# Patient Record
Sex: Female | Born: 1935 | Race: Black or African American | Hispanic: No | Marital: Married | Smoking: Never smoker
Health system: Southern US, Community
[De-identification: ages and names within clinical notes are randomized; demographics above are authoritative.]

## PROBLEM LIST (undated history)

## (undated) DIAGNOSIS — M8580 Other specified disorders of bone density and structure, unspecified site: Secondary | ICD-10-CM

## (undated) DIAGNOSIS — Z860101 Personal history of adenomatous and serrated colon polyps: Secondary | ICD-10-CM

## (undated) DIAGNOSIS — I493 Ventricular premature depolarization: Secondary | ICD-10-CM

## (undated) DIAGNOSIS — K08109 Complete loss of teeth, unspecified cause, unspecified class: Secondary | ICD-10-CM

## (undated) DIAGNOSIS — R351 Nocturia: Secondary | ICD-10-CM

## (undated) DIAGNOSIS — I319 Disease of pericardium, unspecified: Secondary | ICD-10-CM

## (undated) DIAGNOSIS — N1330 Unspecified hydronephrosis: Secondary | ICD-10-CM

## (undated) DIAGNOSIS — Z972 Presence of dental prosthetic device (complete) (partial): Secondary | ICD-10-CM

## (undated) DIAGNOSIS — Z87898 Personal history of other specified conditions: Secondary | ICD-10-CM

## (undated) DIAGNOSIS — I10 Essential (primary) hypertension: Secondary | ICD-10-CM

## (undated) DIAGNOSIS — M1990 Unspecified osteoarthritis, unspecified site: Secondary | ICD-10-CM

## (undated) DIAGNOSIS — H8110 Benign paroxysmal vertigo, unspecified ear: Secondary | ICD-10-CM

## (undated) DIAGNOSIS — K219 Gastro-esophageal reflux disease without esophagitis: Secondary | ICD-10-CM

## (undated) DIAGNOSIS — Z86010 Personal history of colonic polyps: Secondary | ICD-10-CM

## (undated) DIAGNOSIS — C801 Malignant (primary) neoplasm, unspecified: Secondary | ICD-10-CM

## (undated) DIAGNOSIS — E890 Postprocedural hypothyroidism: Secondary | ICD-10-CM

## (undated) DIAGNOSIS — E119 Type 2 diabetes mellitus without complications: Secondary | ICD-10-CM

## (undated) HISTORY — DX: Personal history of other specified conditions: Z87.898

## (undated) HISTORY — DX: Essential (primary) hypertension: I10

## (undated) HISTORY — DX: Other specified disorders of bone density and structure, unspecified site: M85.80

## (undated) HISTORY — PX: COLONOSCOPY W/ POLYPECTOMY: SHX1380

## (undated) HISTORY — PX: TRANSTHORACIC ECHOCARDIOGRAM: SHX275

## (undated) HISTORY — DX: Malignant (primary) neoplasm, unspecified: C80.1

---

## 1982-10-09 HISTORY — PX: TOTAL ABDOMINAL HYSTERECTOMY W/ BILATERAL SALPINGOOPHORECTOMY: SHX83

## 2000-01-23 ENCOUNTER — Encounter: Admission: RE | Admit: 2000-01-23 | Discharge: 2000-01-23 | Payer: Self-pay | Admitting: *Deleted

## 2000-01-23 ENCOUNTER — Encounter: Payer: Self-pay | Admitting: *Deleted

## 2001-02-11 ENCOUNTER — Ambulatory Visit (HOSPITAL_COMMUNITY): Admission: RE | Admit: 2001-02-11 | Discharge: 2001-02-11 | Payer: Self-pay | Admitting: Gastroenterology

## 2001-02-11 ENCOUNTER — Encounter (INDEPENDENT_AMBULATORY_CARE_PROVIDER_SITE_OTHER): Payer: Self-pay

## 2001-02-26 ENCOUNTER — Other Ambulatory Visit: Admission: RE | Admit: 2001-02-26 | Discharge: 2001-02-26 | Payer: Self-pay | Admitting: *Deleted

## 2001-03-05 ENCOUNTER — Encounter: Payer: Self-pay | Admitting: General Surgery

## 2001-03-12 ENCOUNTER — Encounter (INDEPENDENT_AMBULATORY_CARE_PROVIDER_SITE_OTHER): Payer: Self-pay | Admitting: Specialist

## 2001-03-12 ENCOUNTER — Inpatient Hospital Stay (HOSPITAL_COMMUNITY): Admission: RE | Admit: 2001-03-12 | Discharge: 2001-03-14 | Payer: Self-pay | Admitting: General Surgery

## 2001-03-12 HISTORY — PX: OTHER SURGICAL HISTORY: SHX169

## 2003-06-24 ENCOUNTER — Encounter (INDEPENDENT_AMBULATORY_CARE_PROVIDER_SITE_OTHER): Payer: Self-pay | Admitting: *Deleted

## 2003-06-24 ENCOUNTER — Ambulatory Visit (HOSPITAL_COMMUNITY): Admission: RE | Admit: 2003-06-24 | Discharge: 2003-06-24 | Payer: Self-pay | Admitting: Gastroenterology

## 2004-04-04 ENCOUNTER — Other Ambulatory Visit: Admission: RE | Admit: 2004-04-04 | Discharge: 2004-04-04 | Payer: Self-pay | Admitting: Family Medicine

## 2007-01-15 ENCOUNTER — Ambulatory Visit: Payer: Self-pay | Admitting: Internal Medicine

## 2007-01-15 LAB — CONVERTED CEMR LAB
ALT: 18 units/L (ref 0–40)
AST: 21 units/L (ref 0–37)
Albumin: 3.4 g/dL — ABNORMAL LOW (ref 3.5–5.2)
Alkaline Phosphatase: 79 units/L (ref 39–117)
BUN: 10 mg/dL (ref 6–23)
Basophils Absolute: 0 10*3/uL (ref 0.0–0.1)
Basophils Relative: 0.9 % (ref 0.0–1.0)
Bilirubin, Direct: 0.1 mg/dL (ref 0.0–0.3)
CO2: 32 meq/L (ref 19–32)
Calcium: 9.4 mg/dL (ref 8.4–10.5)
Chloride: 109 meq/L (ref 96–112)
Cholesterol: 192 mg/dL (ref 0–200)
Creatinine, Ser: 0.8 mg/dL (ref 0.4–1.2)
Eosinophils Absolute: 0.1 10*3/uL (ref 0.0–0.6)
Eosinophils Relative: 1.4 % (ref 0.0–5.0)
GFR calc Af Amer: 91 mL/min
GFR calc non Af Amer: 75 mL/min
Glucose, Bld: 112 mg/dL — ABNORMAL HIGH (ref 70–99)
HCT: 42.3 % (ref 36.0–46.0)
HDL: 53.6 mg/dL (ref >39.0)
Hemoglobin: 14.9 g/dL (ref 12.0–15.0)
LDL Cholesterol: 114 mg/dL — ABNORMAL HIGH (ref 0–99)
Lymphocytes Relative: 47.6 % — ABNORMAL HIGH (ref 12.0–46.0)
MCHC: 35.2 g/dL (ref 30.0–36.0)
MCV: 88.4 fL (ref 78.0–100.0)
Monocytes Absolute: 0.2 10*3/uL (ref 0.2–0.7)
Monocytes Relative: 5.4 % (ref 3.0–11.0)
Neutro Abs: 1.9 10*3/uL (ref 1.4–7.7)
Neutrophils Relative %: 44.7 % (ref 43.0–77.0)
Platelets: 186 10*3/uL (ref 150–400)
Potassium: 4.4 meq/L (ref 3.5–5.1)
RBC: 4.79 M/uL (ref 3.87–5.11)
RDW: 12.6 % (ref 11.5–14.6)
Sodium: 144 meq/L (ref 135–145)
TSH: 1.13 microintl units/mL (ref 0.35–5.50)
Total Bilirubin: 0.6 mg/dL (ref 0.3–1.2)
Total CHOL/HDL Ratio: 3.6
Total Protein: 6.6 g/dL (ref 6.0–8.3)
Triglycerides: 122 mg/dL (ref 0–149)
VLDL: 24 mg/dL (ref 0–40)
WBC: 4.2 10*3/uL — ABNORMAL LOW (ref 4.5–10.5)

## 2007-03-14 ENCOUNTER — Ambulatory Visit: Payer: Self-pay | Admitting: Internal Medicine

## 2007-07-11 ENCOUNTER — Ambulatory Visit: Payer: Self-pay | Admitting: Internal Medicine

## 2007-07-24 ENCOUNTER — Ambulatory Visit: Payer: Self-pay | Admitting: Internal Medicine

## 2007-07-24 LAB — CONVERTED CEMR LAB
ALT: 16 units/L (ref 0–35)
AST: 21 units/L (ref 0–37)
Basophils Relative: 0.9 % (ref 0.0–1.0)
Bilirubin, Direct: 0.1 mg/dL (ref 0.0–0.3)
CO2: 31 meq/L (ref 19–32)
Calcium: 9.3 mg/dL (ref 8.4–10.5)
Chloride: 110 meq/L (ref 96–112)
Creatinine, Ser: 0.8 mg/dL (ref 0.4–1.2)
Eosinophils Relative: 1.2 % (ref 0.0–5.0)
Glucose, Bld: 110 mg/dL — ABNORMAL HIGH (ref 70–99)
Ketones, ur: NEGATIVE mg/dL
LDL Cholesterol: 112 mg/dL — ABNORMAL HIGH (ref 0–99)
Nitrite: NEGATIVE
Platelets: 164 10*3/uL (ref 150–400)
RBC: 4.34 M/uL (ref 3.87–5.11)
Specific Gravity, Urine: 1.02 (ref 1.000–1.03)
Total Bilirubin: 0.8 mg/dL (ref 0.3–1.2)
Total CHOL/HDL Ratio: 3.3
Total Protein, Urine: NEGATIVE mg/dL
Total Protein: 6.3 g/dL (ref 6.0–8.3)
Urine Glucose: NEGATIVE mg/dL
Urobilinogen, UA: 0.2 (ref 0.0–1.0)
WBC: 4.2 10*3/uL — ABNORMAL LOW (ref 4.5–10.5)

## 2007-08-05 ENCOUNTER — Ambulatory Visit: Payer: Self-pay | Admitting: Internal Medicine

## 2007-08-05 DIAGNOSIS — I10 Essential (primary) hypertension: Secondary | ICD-10-CM | POA: Insufficient documentation

## 2007-08-05 DIAGNOSIS — R0789 Other chest pain: Secondary | ICD-10-CM | POA: Insufficient documentation

## 2007-08-08 ENCOUNTER — Ambulatory Visit: Payer: Self-pay

## 2007-08-08 ENCOUNTER — Encounter: Payer: Self-pay | Admitting: Internal Medicine

## 2007-08-14 DIAGNOSIS — M899 Disorder of bone, unspecified: Secondary | ICD-10-CM | POA: Insufficient documentation

## 2007-08-14 DIAGNOSIS — Z87898 Personal history of other specified conditions: Secondary | ICD-10-CM | POA: Insufficient documentation

## 2007-08-14 DIAGNOSIS — M949 Disorder of cartilage, unspecified: Secondary | ICD-10-CM

## 2007-09-02 ENCOUNTER — Ambulatory Visit: Payer: Self-pay | Admitting: Internal Medicine

## 2007-09-02 DIAGNOSIS — I319 Disease of pericardium, unspecified: Secondary | ICD-10-CM | POA: Insufficient documentation

## 2007-09-04 ENCOUNTER — Ambulatory Visit: Payer: Self-pay | Admitting: Cardiology

## 2007-09-11 LAB — CONVERTED CEMR LAB
C3 Complement: 121 mg/dL (ref 88–201)
ds DNA Ab: <1 (ref <5)

## 2007-09-13 ENCOUNTER — Encounter: Admission: RE | Admit: 2007-09-13 | Discharge: 2007-09-13 | Payer: Self-pay | Admitting: Internal Medicine

## 2007-09-19 ENCOUNTER — Encounter (INDEPENDENT_AMBULATORY_CARE_PROVIDER_SITE_OTHER): Payer: Self-pay | Admitting: Interventional Radiology

## 2007-09-19 ENCOUNTER — Encounter: Payer: Self-pay | Admitting: Internal Medicine

## 2007-09-19 ENCOUNTER — Encounter: Admission: RE | Admit: 2007-09-19 | Discharge: 2007-09-19 | Payer: Self-pay | Admitting: Internal Medicine

## 2007-09-19 ENCOUNTER — Other Ambulatory Visit: Admission: RE | Admit: 2007-09-19 | Discharge: 2007-09-19 | Payer: Self-pay | Admitting: Interventional Radiology

## 2007-10-28 ENCOUNTER — Ambulatory Visit: Payer: Self-pay | Admitting: Internal Medicine

## 2007-11-08 ENCOUNTER — Telehealth: Payer: Self-pay | Admitting: Internal Medicine

## 2007-11-25 ENCOUNTER — Ambulatory Visit: Payer: Self-pay | Admitting: Endocrinology

## 2007-11-25 ENCOUNTER — Telehealth: Payer: Self-pay | Admitting: Internal Medicine

## 2008-02-24 ENCOUNTER — Ambulatory Visit: Payer: Self-pay | Admitting: Internal Medicine

## 2008-02-24 DIAGNOSIS — H8110 Benign paroxysmal vertigo, unspecified ear: Secondary | ICD-10-CM

## 2008-02-28 ENCOUNTER — Encounter: Admission: RE | Admit: 2008-02-28 | Discharge: 2008-03-23 | Payer: Self-pay | Admitting: Internal Medicine

## 2008-02-28 ENCOUNTER — Encounter: Payer: Self-pay | Admitting: Internal Medicine

## 2008-03-10 ENCOUNTER — Encounter: Admission: RE | Admit: 2008-03-10 | Discharge: 2008-03-10 | Payer: Self-pay | Admitting: Endocrinology

## 2008-03-17 ENCOUNTER — Telehealth: Payer: Self-pay | Admitting: Endocrinology

## 2008-04-22 ENCOUNTER — Encounter: Payer: Self-pay | Admitting: Internal Medicine

## 2008-06-12 ENCOUNTER — Ambulatory Visit: Payer: Self-pay | Admitting: Internal Medicine

## 2008-06-12 LAB — CONVERTED CEMR LAB
BUN: 11 mg/dL (ref 6–23)
Cholesterol: 163 mg/dL (ref 0–200)
GFR calc Af Amer: 91 mL/min
Glucose, Bld: 103 mg/dL — ABNORMAL HIGH (ref 70–99)
HDL: 50.9 mg/dL (ref >39.0)
Potassium: 3.6 meq/L (ref 3.5–5.1)
VLDL: 22 mg/dL (ref 0–40)

## 2008-06-22 ENCOUNTER — Ambulatory Visit: Payer: Self-pay | Admitting: Internal Medicine

## 2008-08-06 ENCOUNTER — Ambulatory Visit: Payer: Self-pay | Admitting: Endocrinology

## 2008-08-06 DIAGNOSIS — E042 Nontoxic multinodular goiter: Secondary | ICD-10-CM

## 2008-11-20 ENCOUNTER — Encounter: Payer: Self-pay | Admitting: Internal Medicine

## 2008-12-01 ENCOUNTER — Encounter: Payer: Self-pay | Admitting: Internal Medicine

## 2008-12-08 ENCOUNTER — Encounter: Payer: Self-pay | Admitting: Internal Medicine

## 2008-12-08 LAB — HM COLONOSCOPY: HM Colonoscopy: ABNORMAL

## 2008-12-21 ENCOUNTER — Encounter: Payer: Self-pay | Admitting: Internal Medicine

## 2008-12-30 ENCOUNTER — Ambulatory Visit: Payer: Self-pay | Admitting: Internal Medicine

## 2008-12-30 DIAGNOSIS — K644 Residual hemorrhoidal skin tags: Secondary | ICD-10-CM | POA: Insufficient documentation

## 2009-01-12 ENCOUNTER — Ambulatory Visit: Payer: Self-pay | Admitting: Internal Medicine

## 2009-04-14 ENCOUNTER — Ambulatory Visit: Payer: Self-pay | Admitting: Internal Medicine

## 2009-04-14 DIAGNOSIS — N951 Menopausal and female climacteric states: Secondary | ICD-10-CM

## 2009-04-14 LAB — CONVERTED CEMR LAB

## 2009-04-16 ENCOUNTER — Encounter: Payer: Self-pay | Admitting: Internal Medicine

## 2009-04-22 ENCOUNTER — Encounter: Payer: Self-pay | Admitting: Internal Medicine

## 2009-06-01 ENCOUNTER — Telehealth: Payer: Self-pay | Admitting: Internal Medicine

## 2009-07-01 ENCOUNTER — Ambulatory Visit: Payer: Self-pay | Admitting: Internal Medicine

## 2009-12-15 ENCOUNTER — Ambulatory Visit: Payer: Self-pay | Admitting: Internal Medicine

## 2009-12-15 DIAGNOSIS — R002 Palpitations: Secondary | ICD-10-CM

## 2010-05-06 ENCOUNTER — Encounter: Payer: Self-pay | Admitting: Internal Medicine

## 2010-05-11 ENCOUNTER — Encounter: Payer: Self-pay | Admitting: Internal Medicine

## 2010-06-30 ENCOUNTER — Ambulatory Visit: Payer: Self-pay | Admitting: Internal Medicine

## 2010-07-04 ENCOUNTER — Telehealth: Payer: Self-pay | Admitting: Internal Medicine

## 2010-07-06 ENCOUNTER — Encounter: Payer: Self-pay | Admitting: Internal Medicine

## 2010-07-06 LAB — CONVERTED CEMR LAB
ALT: 13 U/L
AST: 18 U/L
Albumin: 4 g/dL
Alkaline Phosphatase: 85 U/L
BUN: 15 mg/dL
Basophils Absolute: 0 K/uL
Basophils Relative: 0 %
Bilirubin, Direct: 0.1 mg/dL
CO2: 28 meq/L
Calcium: 9.4 mg/dL
Chloride: 104 meq/L
Creatinine, Ser: 0.98 mg/dL
Eosinophils Absolute: 0 K/uL
Eosinophils Relative: 1 %
Free T4: 1.12 ng/dL
Glucose, Bld: 94 mg/dL
HCT: 43.2 %
Hemoglobin: 14.3 g/dL
Indirect Bilirubin: 0.5 mg/dL
Lymphocytes Relative: 40 %
Lymphs Abs: 1.9 K/uL
MCHC: 33.1 g/dL
MCV: 90.9 fL
Monocytes Absolute: 0.6 K/uL
Monocytes Relative: 11 %
Neutro Abs: 2.3 K/uL
Neutrophils Relative %: 47 %
Platelets: 163 K/uL
Potassium: 4.4 meq/L
RBC: 4.75 M/uL
RDW: 13.6 %
Sodium: 142 meq/L
TSH: 1.469 u[IU]/mL
Total Bilirubin: 0.6 mg/dL
Total Protein: 6.6 g/dL
WBC: 4.8 10*3/microliter

## 2010-07-08 ENCOUNTER — Encounter: Payer: Self-pay | Admitting: Internal Medicine

## 2010-07-25 ENCOUNTER — Encounter: Payer: Self-pay | Admitting: Internal Medicine

## 2010-07-25 ENCOUNTER — Ambulatory Visit: Payer: Self-pay | Admitting: Internal Medicine

## 2010-08-15 ENCOUNTER — Encounter: Payer: Self-pay | Admitting: Internal Medicine

## 2010-10-26 ENCOUNTER — Ambulatory Visit
Admission: RE | Admit: 2010-10-26 | Discharge: 2010-10-26 | Payer: Self-pay | Source: Home / Self Care | Attending: Internal Medicine | Admitting: Internal Medicine

## 2010-10-26 ENCOUNTER — Encounter: Payer: Self-pay | Admitting: Internal Medicine

## 2010-10-26 DIAGNOSIS — K3189 Other diseases of stomach and duodenum: Secondary | ICD-10-CM | POA: Insufficient documentation

## 2010-10-26 DIAGNOSIS — R1013 Epigastric pain: Secondary | ICD-10-CM

## 2010-10-26 LAB — CONVERTED CEMR LAB
BUN: 12 mg/dL (ref 6–23)
Calcium: 9.6 mg/dL (ref 8.4–10.5)
Creatinine, Ser: 0.85 mg/dL (ref 0.40–1.20)
Glucose, Bld: 96 mg/dL (ref 70–99)
HCT: 44.4 % (ref 36.0–46.0)
Hemoglobin: 14.9 g/dL (ref 12.0–15.0)
MCHC: 33.6 g/dL (ref 30.0–36.0)
MCV: 90.6 fL (ref 78.0–100.0)
Magnesium: 2.1 mg/dL (ref 1.5–2.5)
RBC: 4.9 M/uL (ref 3.87–5.11)

## 2010-10-27 ENCOUNTER — Encounter: Payer: Self-pay | Admitting: Internal Medicine

## 2010-10-28 ENCOUNTER — Telehealth (INDEPENDENT_AMBULATORY_CARE_PROVIDER_SITE_OTHER): Payer: Self-pay | Admitting: *Deleted

## 2010-10-30 ENCOUNTER — Encounter: Payer: Self-pay | Admitting: Endocrinology

## 2010-11-02 ENCOUNTER — Ambulatory Visit: Admission: RE | Admit: 2010-11-02 | Discharge: 2010-11-02 | Payer: Self-pay | Source: Home / Self Care

## 2010-11-08 ENCOUNTER — Encounter: Payer: Self-pay | Admitting: Internal Medicine

## 2010-11-08 NOTE — Miscellaneous (Signed)
Summary: BONE DENSITY  Clinical Lists Changes  Orders: Added new Test order of T-Bone Densitometry (77080) - Signed Added new Test order of T-Lumbar Vertebral Assessment (77082) - Signed 

## 2010-11-08 NOTE — Progress Notes (Signed)
Summary: should she be concerned about her recent weight loss   Phone Note Call from Patient Call back at Home Phone [phone]   Caller: Patient Call For: yoo  Summary of Call: She is concerned about her recent weight loss.  Should she be checked out for the weight loss as she has not been trying to lose weight.  Initial call taken by: Roselle Locus,  July 04, 2010 8:22 AM  Follow-up for Phone Call        please have her come in for blood work BMET: 401.9 tsh, free t4  - wt loss cbcd - wt loss LFTs - wt loss  also inform pt - according to our records, pt has not had signficant  wt loss Follow-up by: D. Thomos Lemons DO,  July 04, 2010 5:32 PM  Additional Follow-up for Phone Call Additional follow up Details #1::        call placed to patient at [phone], she has been advise per Dr Artist Pais instructions. She stated her husband has an appointment with Dr Drue Dun at [facility] on Wednesday 9/28. She will have her labs drawn then Additional Follow-up by: Glendell Docker CMA,  July 05, 2010 3:39 PM

## 2010-11-08 NOTE — Letter (Signed)
   [place] at [facility] [address] [place], [place]  [address]  [place] Phone: [phone]      July 08, 2010   Alison [name] [address] [place], [place] [address]  RE:  LAB RESULTS  Dear  Ms. Morel,  The following is an interpretation of your most recent lab tests.  Please take note of any instructions provided or changes to medications that have resulted from your lab work.  ELECTROLYTES:  Good - no changes needed  KIDNEY FUNCTION TESTS:  Good - no changes needed  LIVER FUNCTION TESTS:  Good - no changes needed   THYROID STUDIES:  Thyroid studies normal TSH: 1.469           Sincerely Yours,    Dr. Thomos Lemons  Appended Document:  mailed

## 2010-11-08 NOTE — Miscellaneous (Signed)
Summary: Mammogram  Clinical Lists Changes  Observations: Added new observation of MAMMOGRAM: normal (05/06/2010 15:10)      Preventive Care Screening  Mammogram:    Date:  05/06/2010    Results:  normal

## 2010-11-08 NOTE — Assessment & Plan Note (Signed)
Summary: f/u on meds- jr   Vital Signs:  Patient profile:   77 year old female Height:      64 inches Weight:      159.50 pounds BMI:     27.48 O2 Sat:      100 % on Room air Temp:     98.4 degrees F oral Pulse rate:   53 / minute Pulse rhythm:   regular Resp:     16 per minute BP sitting:   140 / 80  (right arm) Cuff size:   regular  Vitals Entered By: Glendell Docker CMA (December 15, 2009 9:43 AM)  O2 Flow:  Room air CC: Rm 3- Follow up disease management   Primary Care Provider:  DThomos Lemons DO  CC:  Rm 3- Follow up disease management.  History of Present Illness: Hypertension Follow-Up      This is a 77 year old woman who presents for Hypertension follow-up.  The patient denies lightheadedness.  Associated symptoms include palpitations.  The patient denies the following associated symptoms: chest pain and leg edema.  Compliance with medications (by patient report) has been near 100%.  The patient reports that dietary compliance has been fair.  Pt stopped amlodipine because she thought it caused palpitations  Allergies: 1)  ! 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 Madilyn Fireman) Osteopenia Hemorrhoids  Right thyroid nodule       Past Surgical History: Colonoscopy 1/07 Hemicolectomy  Hysterectomy with BSO 1984          Family History: Family History High cholesterol Family History Hypertension Brother with esophageal cancer no thyroid problems      Social History: Retired from [employer].   Married  Alcohol use-no    Never Smoked    Physical Exam  General:  alert, well-developed, and well-nourished.   Lungs:  normal respiratory effort, normal breath sounds, no crackles, and no wheezes.   Heart:  normal rate, regular rhythm, no murmur, and no gallop.   Extremities:  No lower extremity edema    Impression & Recommendations:  Problem # 1:  HYPERTENSION  (ICD-401.9) Assessment Deteriorated Pt stopped amlodipine because she attributed palpitations to taking amlodipine.  I suspect symptoms from caffeine use.  restart amlodipine.  monitor BP at home.  The following medications were removed from the medication list:    Amlodipine Besylate 5 Mg Tabs (Amlodipine besylate) .Marland Kitchen... 1/2 by mouth qd Her updated medication list for this problem includes:    Bisoprolol Fumarate 5 Mg Tabs (Bisoprolol fumarate) .Marland Kitchen... 1/2 tablet by mouth once daily    Amlodipine Besylate 2.5 Mg Tabs (Amlodipine besylate) ..... One by mouth once daily  BP today: 140/80 Prior BP: 110/60 (07/01/2009)  Labs Reviewed: K+: 3.6 (06/12/2008) Creat: : 0.8 (06/12/2008)   Chol: 163 (06/12/2008)   HDL: 50.9 (06/12/2008)   LDL: 90 (06/12/2008)   TG: 111 (06/12/2008)  Problem # 2:  PALPITATIONS, OCCASIONAL (ICD-785.1) Intermittent fluttering.  no assoc symptoms.  stop caffeine.  continue low dose b blocker.   Her updated medication list for this problem includes:    Bisoprolol Fumarate 5 Mg Tabs (Bisoprolol fumarate) .Marland Kitchen... 1/2 tablet by mouth once daily  08/08/2007 2D Echo LEFT VENTRICLE: -  Left ventricular size was normal. -  Overall left ventricular systolic function was normal. -  Left ventricular ejection fraction was estimated , range being 60       % to  65 %. -  There was no diagnostic evidence of left ventricular regional       wall motion abnormalities. -  Left ventricular wall thickness was norma  Complete Medication List: 1)  Colace 100 Mg Caps (Docusate sodium) .... One by mouth bid 2)  Bisoprolol Fumarate 5 Mg Tabs (Bisoprolol fumarate) .... 1/2 tablet by mouth once daily 3)  Amlodipine Besylate 2.5 Mg Tabs (Amlodipine besylate) .... One by mouth once daily  Patient Instructions: 1)  Please schedule a follow-up appointment in 6 months. 2)  BMP prior to visit, ICD-9: 401.9 3)  Lipid Panel prior to visit, ICD-9: 401.9 4)  TSH prior to visit, ICD-9: 401.9 5)   CBC w/ Diff prior to visit, ICD-9: 785.1 6)  Please return for lab work one (1) week before your next appointment.  7)  Keep log of home blood pressure readings. Prescriptions: AMLODIPINE BESYLATE 2.5 MG TABS (AMLODIPINE BESYLATE) one by mouth once daily  #30 x 5   Entered and Authorized by:   D. Thomos Lemons DO   Signed by:   D. Thomos Lemons DO on 12/15/2009   Method used:   [facility] to        [facility]  [address] [phone]* (retail)       [address]       [place], [place]  [address]       Ph: [phone] or [phone]       Fax: [phone]   RxID:   [id number] BISOPROLOL FUMARATE 5 MG TABS (BISOPROLOL FUMARATE) 1/2 tablet by mouth once daily  #45 x 3   Entered and Authorized by:   D. Thomos Lemons DO   Signed by:   D. Thomos Lemons DO on 12/15/2009   Method used:   [facility]        [facility] [phone]* (retail)       [address]       [place], [place]  [address]       Ph: [phone] or [phone]       Fax: [phone]   RxID:   [id number]   Current Allergies (reviewed today): ! * TETANUS

## 2010-11-08 NOTE — Letter (Signed)
   [place] at [facility] [address] [place], [place]  [address]  [place] Phone: [phone]      August 15, 2010   JODI CRISCUOLO [address] [place], [place] [address]  RE:  LAB RESULTS  Dear  Ms. Rothman,  The following is an interpretation of your most recent lab tests.  Please take note of any instructions provided or changes to medications that have resulted from your lab work.      Bone density test - normal       Sincerely Yours,    Dr. Thomos Lemons  Appended Document:  mailed

## 2010-11-08 NOTE — Assessment & Plan Note (Signed)
Summary: Routine follow up/DT--Rm    Vital Signs:  Patient profile:   77 year old female Height:      64 inches Weight:      153.50 pounds BMI:     26.44 Temp:     98.0 degrees F oral Pulse rate:   66 / minute Pulse rhythm:   regular Resp:     16 per minute BP sitting:   120 / 70  (right arm) Cuff size:   regular  Vitals Entered By: Mervin Kung CMA Duncan Dull) (June 30, 2010 8:43 AM) Is Patient Diabetic? No Pain Assessment Patient in pain? no        Primary Care Moncerrat Burnstein:  Dondra Spry DO   History of Present Illness:  Hypertension Follow-Up      This is a 77 year old woman who presents for Hypertension follow-up.  The patient denies lightheadedness, headaches, and edema.  The patient denies the following associated symptoms: chest pain.  Compliance with medications (by patient report) has been near 100%.  occ palpitation.  no assoc symptoms.  no syncope  Preventive Screening-Counseling & Management  Alcohol-Tobacco     Smoking Status: never  Allergies: 1)  ! 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 Madilyn Fireman) Osteopenia Hemorrhoids   Right thyroid nodule       Past Surgical History: Colonoscopy 1/07 Hemicolectomy  Hysterectomy with BSO 1984           Family History: Family History High cholesterol Family History Hypertension Brother with esophageal cancer no thyroid problems       Social History: Retired from [employer].   Married  Alcohol use-no    Never Smoked      Review of Systems  The patient denies weight gain, chest pain, dyspnea on exertion, abdominal pain, melena, hematochezia, and severe indigestion/heartburn.         denies memory loss,  no hx of falls intermittent constipation  Physical Exam  General:  alert, well-developed, and well-nourished.   Head:  normocephalic and atraumatic.   Eyes:  pupils equal, pupils round, and pupils  reactive to light.   Ears:  R ear normal and L ear normal.  gross hearing is normal (able to hear finger rub bilaterally) Mouth:  pharynx pink and moist.   Neck:  No deformities, masses, or tenderness noted.no carotid bruits.   Breasts:  No mass, nodules, thickening, tenderness, bulging, retraction, inflamation, nipple discharge or skin changes noted.   Lungs:  normal respiratory effort and normal breath sounds.   Heart:  normal rate, regular rhythm, and no gallop.   Abdomen:  soft, non-tender, normal bowel sounds, no masses, no hepatomegaly, and no splenomegaly.   Extremities:  No lower extremity edema  Neurologic:  cranial nerves II-XII intact and gait normal.   Psych:  normally interactive, good eye contact, not anxious appearing, and not depressed appearing.     Impression & Recommendations:  Problem # 1:  HYPERTENSION (ICD-401.9) Assessment Unchanged  Her updated medication list for this problem includes:    Bisoprolol Fumarate 5 Mg Tabs (Bisoprolol fumarate) .Marland Kitchen... 1/2 tablet by mouth once daily    Amlodipine Besylate 2.5 Mg Tabs (Amlodipine besylate) ..... One by mouth once daily  BP today: 120/70 Prior BP: 140/80 (12/15/2009)  Labs Reviewed: K+: 3.6 (06/12/2008) Creat: : 0.8 (06/12/2008)   Chol: 163 (06/12/2008)   HDL: 50.9 (06/12/2008)   LDL: 90 (06/12/2008)   TG: 111 (  06/12/2008)  Problem # 2:  PREVENTIVE HEALTH CARE (ICD-V70.0) Reviewed adult health maintenance protocols. Pt counseled on diet and exercise. she will try to lose approx 10 lbs.  she walks daily  Mammogram: normal (05/06/2010) Pap smear: Hysterectomy-no longer have done (04/14/2009) Colonoscopy: abnormal (12/08/2008) Td Booster: declined (07/11/2007)   Flu Vax: Declined (07/01/2009)   Pneumovax: declined (07/11/2007) Chol: 163 (06/12/2008)   HDL: 50.9 (06/12/2008)   LDL: 90 (06/12/2008)   TG: 111 (06/12/2008) TSH: 1.14 (08/06/2008)   HgbA1C: 5.6 (07/24/2007)     Complete Medication List: 1)  Colace  100 Mg Caps (Docusate sodium) .... One by mouth bid 2)  Bisoprolol Fumarate 5 Mg Tabs (Bisoprolol fumarate) .... 1/2 tablet by mouth once daily 3)  Amlodipine Besylate 2.5 Mg Tabs (Amlodipine besylate) .... One by mouth once daily  Other Orders: EKG w/ Interpretation ([address])  Patient Instructions: 1)  Please schedule a follow-up appointment in 6 months. 2)  Schedule screening DEXA at [facility] Prescriptions: AMLODIPINE BESYLATE 2.5 MG TABS (AMLODIPINE BESYLATE) one by mouth once daily  #90 x 1   Entered and Authorized by:   D. Thomos Lemons DO   Signed by:   D. Thomos Lemons DO on 06/30/2010   Method used:   [facility]  [address] [phone]* (retail)       [address]       [place], [place]  [address]       Ph: [phone] or [phone]       Fax: [phone]   RxID:   [id number] BISOPROLOL FUMARATE 5 MG TABS (BISOPROLOL FUMARATE) 1/2 tablet by mouth once daily  #45 x 1   Entered and Authorized by:   D. Thomos Lemons DO   Signed by:   D. Thomos Lemons DO on 06/30/2010   Method used:   [facility]        [facility]  [address] [phone]* (retail)       [address]       [place], [place]  [id number]       Ph: [phone] or [phone]       Fax: [phone]   RxID:   [id number]   Current Allergies (reviewed today): ! * TETANUS      Contraindications/Deferment of Procedures/Staging:    Test/Procedure: FLU VAX    Reason for deferment: patient declined     Test/Procedure: Pneumovax vaccine    Reason for deferment: patient declined     Test/Procedure: TD vaccine    Reason for deferment: declined     Vital Signs:  Patient Profile:   77 year old female Height:     64 inches Weight:      153.50 pounds BMI:     26.44 Temp:     98.0 degrees F oral Pulse rate:   66 / minute Pulse rhythm:   regular Resp:     16 per minute BP sitting:   120 / 70 Cuff size:   regular

## 2010-11-10 NOTE — Letter (Signed)
   [place] at [facility] [address] [place], [place]  [address]  [place] Phone: [phone]      October 27, 2010   Alison [name] [address] [place], [place] [address]  RE:  LAB RESULTS  Dear  [name],  The following is an interpretation of your most recent lab tests.  Please take note of any instructions provided or changes to medications that have resulted from your lab work.  ELECTROLYTES:  Good - no changes needed  KIDNEY FUNCTION TESTS:  Good - no changes needed   THYROID STUDIES:  Thyroid studies normal TSH: 1.743      CBC:  Good - no changes needed  Magnesium level - normal       Sincerely Yours,    Dr. Thomos Lemons  Appended Document:  mailed

## 2010-11-10 NOTE — Progress Notes (Signed)
Summary: holter Monitor   Phone Note Outgoing Call Call back at Home Phone [phone]   Call placed by: Marcos Eke,  October 28, 2010 10:54 AM Summary of Call: Call patient and left messege for her to call back to sch. [facility] for monitor.  Follow-up for Phone Call        Pt had monitor put on today Follow-up by: Marcos Eke,  November 02, 2010 12:16 PM

## 2010-11-16 NOTE — Procedures (Signed)
Summary: summary report  summary report   Imported By: Mirna Mires 11/08/2010 09:27:09  _____________________________________________________________________  External Attachment:    Type:   Image     Comment:   External Document

## 2010-11-16 NOTE — Assessment & Plan Note (Signed)
Summary: TALK ABOUT BLOOD PRESSURE MEDS./[facility]   Vital Signs:  Patient profile:   77 year old female Height:      64 inches Weight:      159.25 pounds BMI:     27.43 O2 Sat:      99 % on Room air Temp:     97.8 degrees F oral Pulse rate:   71 / minute Resp:     18 per minute BP sitting:   122 / 70  (left arm) Cuff size:   large  Vitals Entered By: Glendell Docker CMA (October 26, 2010 9:11 AM)  O2 Flow:  Room air CC: Discuss Blood Pressure Is Patient Diabetic? No Pain Assessment Patient in pain? no      Comments patient state her heart does not feel comfortable, she denies pain, she states she has a lot of gas and belching after meals, off and on for the past week   Primary Care Provider:  Dondra Spry DO  CC:  Discuss Blood Pressure.  History of Present Illness: 1 week ago she notes irregular heart beats "not beating smoothly" no chest pains no dizziness  denies intake of caffeinated beverages - drinks mostly water   Preventive Screening-Counseling & Management  Alcohol-Tobacco     Smoking Status: never  Allergies: 1)  ! 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 Madilyn Fireman) Osteopenia  Hemorrhoids   Right thyroid nodule       Past Surgical History: Colonoscopy 1/07 Hemicolectomy  Hysterectomy with BSO 1984           SH/Risk Factors reviewed for relevance  Family History: Family History High cholesterol Family History Hypertension Brother with esophageal cancer no thyroid problems        Social History: Retired from [employer].   Married  Alcohol use-no     Never Smoked      Physical Exam  General:  alert, well-developed, and well-nourished.   Head:  normocephalic and atraumatic.   Neck:  No deformities, masses, or tenderness noted.no carotid bruits.   Lungs:  normal respiratory effort and normal breath sounds.   Heart:  normal rate, regular rhythm,  and no gallop.   Extremities:  No lower extremity edema  Neurologic:  cranial nerves II-XII intact and gait normal.     Impression & Recommendations:  Problem # 1:  PALPITATIONS, OCCASIONAL (ICD-785.1) obtain holter monitor EKG shows NSR prev 2 D Echo 07/2007  SUMMARY   -  Overall left ventricular systolic function was normal. Left         ventricular ejection fraction was estimated , range being 60         % to 65 %. There was no diagnostic evidence of left         ventricular regional wall motion abnormalities. Features were         consistent with mild diastolic dysfunction.   -  There was mild mitral valvular regurgitation.   -  There was a small pericardial effusion circumferential to the         heart. Her updated medication list for this problem includes:    Bisoprolol Fumarate 5 Mg Tabs (Bisoprolol fumarate) .Marland Kitchen... 1/2 tablet by mouth once daily  Orders: T-Basic Metabolic Panel [phone]) T-CBC No Diff ([id number]) T-Magnesium (29562-13086) T-TSH [id number]) T-T4, Free (28413-24401) EKG w/ Interpretation (93000) Cardiology Referral (Cardiology)  Problem # 2:  DYSPEPSIA, MILD (ICD-536.8) use otc zantac  as needed  Complete Medication List: 1)  Bisoprolol Fumarate 5 Mg Tabs (Bisoprolol fumarate) .... 1/2 tablet by mouth once daily 2)  Amlodipine Besylate 2.5 Mg Tabs (Amlodipine besylate) .... One by mouth once daily 3)  Ranitidine Hcl 150 Mg Tabs (Ranitidine hcl) .... One by mouth two times a day as needed  Patient Instructions: 1)  Please schedule a follow-up appointment in 2 months. Prescriptions: RANITIDINE HCL 150 MG TABS (RANITIDINE HCL) one by mouth two times a day as needed  #60 x 3   Entered and Authorized by:   D. Thomos Lemons DO   Signed by:   D. Thomos Lemons DO on 10/26/2010   Method used:   [facility]        [facility]  [address] [phone][facility] (retail)       [address]       [place], [place]  [address]       Ph:  [phone] or [phone]       Fax: [phone]   RxID:   [id number]    Orders Added: 1)  T-Basic Metabolic Panel [id number] 2)  T-CBC No Diff [[address]27-10000] 3)  T-Magnesium [28315-17616] 4)  T-TSH [07371-06269] 5)  T-T4, Free [48546-27035] 6)  EKG w/ Interpretation [93000] 7)  Cardiology Referral [Cardiology] 8)  Est. Patient Level III [00938]   Immunization History:  Influenza Immunization History:    Influenza:  declined (10/26/2010)   Contraindications/Deferment of Procedures/Staging:    Test/Procedure: FLU VAX    Reason for deferment: patient declined   Immunization History:  Influenza Immunization History:    Influenza:  Declined (10/26/2010)  Current Allergies (reviewed today): ! * TETANUS

## 2010-11-23 ENCOUNTER — Encounter: Payer: Self-pay | Admitting: Cardiology

## 2010-11-23 ENCOUNTER — Ambulatory Visit (INDEPENDENT_AMBULATORY_CARE_PROVIDER_SITE_OTHER): Payer: Medicare Other | Admitting: Cardiology

## 2010-11-23 DIAGNOSIS — R002 Palpitations: Secondary | ICD-10-CM

## 2010-11-30 NOTE — Assessment & Plan Note (Signed)
Summary: NP6/PALP    Visit Type:  Initial Consult per Dr. Artist Pais Primary Provider:  D. Thomos Lemons DO  CC:  Chest discomfort.  History of Present Illness: 77 year old female for evaluation of palpitations. Echocardiogram in 2008 showed normal LV function, mild mitral regurgitation and a small pericardial effusion. Holter monitor in January of 2012 showed sinus rhythm with occasional PAC, PVC and brief PAT-5 beats. No symptoms reported. TSH in January of 2012 was normal. Patient does have some dyspnea on exertion but no orthopnea, PND or pedal edema. She has not had chest pain. She occasionally feels her heart "squeeze funny" particularly at night. She does not have sustained palpitations. There is no history of syncope. Because of the above we were asked to further evaluate.  Current Medications (verified): 1)  Bisoprolol Fumarate 5 Mg Tabs (Bisoprolol Fumarate) .... 1/2 Tablet By Mouth Once Daily 2)  Amlodipine Besylate 2.5 Mg Tabs (Amlodipine Besylate) .... One By Mouth Once Daily  Allergies: 1)  ! 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 Madilyn Fireman) Right thyroid nodule       Past Surgical History: Reviewed history from 10/26/2010 and no changes required. Colonoscopy 1/07 Hemicolectomy  Hysterectomy with BSO 1984            Family History: Reviewed history from 10/26/2010 and no changes required. Family History High cholesterol Family History Hypertension Brother with esophageal cancer no thyroid problems        Social History: Reviewed history from 10/26/2010 and no changes required. Retired from [employer].   Married  Alcohol use-no     Never Smoked      Review of Systems       no fevers or chills, productive cough,  hemoptysis, dysphasia, odynophagia, melena, hematochezia, dysuria, hematuria, rash, seizure activity, orthopnea, PND, pedal edema, claudication. Remaining systems are negative.   Vital Signs:  Patient profile:   77 year old female Height:      64 inches Weight:      159.25 pounds BMI:     27.43 Pulse rate:   60 / minute Pulse rhythm:   regular Resp:     18 per minute BP sitting:   130 / 74  (left arm) Cuff size:   large  Vitals Entered By: Vikki Ports (November 23, 2010 9:48 AM)  Physical Exam  General:  Well developed/well nourished in NAD Skin warm/dry Patient not depressed No peripheral clubbing Back-normal HEENT-normal/normal eyelids Neck supple/normal carotid upstroke bilaterally; no bruits; no JVD; no thyromegaly chest - CTA/ normal expansion CV - RRR/normal S1 and S2; no murmurs, rubs or gallops;  PMI nondisplaced Abdomen -NT/ND, no HSM, no mass, + bowel sounds, no bruit 2+ femoral pulses, no bruits Ext-no edema, chords, 2+ DP Neuro-grossly nonfocal     EKG  Procedure date:  10/26/2010  Findings:      Sinus rhythm with no ST changes.  Impression & Recommendations:  Problem # 1:  PALPITATIONS, OCCASIONAL (ICD-785.1) Patient describes a funny heart beat. She does not have sustained palpitations. Her Holter monitor showed occasional PAC, PVC and brief PAT. Continue beta blocker. I cannot increase the dose as she is mildly bradycardic at times. Check echocardiogram. If symptoms worsen plan CardioNet as she did not have significant symptoms during her Holter monitor. Her  updated medication list for this problem includes:    Bisoprolol Fumarate 5 Mg Tabs (Bisoprolol fumarate) .Marland Kitchen... 1/2 tablet by mouth once daily    Amlodipine Besylate 2.5 Mg Tabs (Amlodipine besylate) ..... One by mouth once daily  Orders: Echocardiogram (Echo)  Problem # 2:  HYPERTENSION (ICD-401.9) Blood pressure controlled. Continue present medications. Her updated medication list for  this problem includes:    Bisoprolol Fumarate 5 Mg Tabs (Bisoprolol fumarate) .Marland Kitchen... 1/2 tablet by mouth once daily    Amlodipine Besylate 2.5 Mg Tabs (Amlodipine besylate) ..... One by mouth once daily  Problem # 3:  PERICARDIAL EFFUSION (ICD-423.9) History of small pericardial effusion. Repeat echocardiogram.  Patient Instructions: 1)  Your physician has requested that you have an echocardiogram.  Echocardiography is a painless test that uses sound waves to create images of your heart. It provides your doctor with information about the size and shape of your heart and how well your heart's chambers and valves are working.  This procedure takes approximately one hour. There are no restrictions for this procedure.

## 2010-12-02 ENCOUNTER — Other Ambulatory Visit (HOSPITAL_COMMUNITY): Payer: Medicare Other

## 2010-12-19 ENCOUNTER — Encounter: Payer: Self-pay | Admitting: Internal Medicine

## 2010-12-26 ENCOUNTER — Ambulatory Visit: Payer: Self-pay | Admitting: Internal Medicine

## 2010-12-30 ENCOUNTER — Other Ambulatory Visit (HOSPITAL_COMMUNITY): Payer: Medicare Other

## 2011-01-10 ENCOUNTER — Other Ambulatory Visit: Payer: Self-pay | Admitting: Internal Medicine

## 2011-01-10 DIAGNOSIS — I10 Essential (primary) hypertension: Secondary | ICD-10-CM

## 2011-01-11 NOTE — Telephone Encounter (Signed)
rx refill for Bisoprolol sent to pharmacy

## 2011-02-24 NOTE — Op Note (Signed)
[facility]  Patient:    Alison [name], Alison [name] [facility]                   MRN: [id number] Proc. Date: 03/12/01 Adm. Date:  [id number] Attending:  Delsa Bern CC:         Everardo All. Madilyn Fireman, M.D.   Operative Report  PREOPERATIVE DIAGNOSIS:  POSTOPERATIVE DIAGNOSIS:  PROCEDURE:  Laparoscopic-assisted right hemicolectomy.  SURGEON:  Lorne Skeens. Hoxworth, M.D.  ASSISTANT:  Gita Kudo, M.D.  ANESTHESIA:  General.  BRIEF HISTORY:  The patient is a 77 year old black female who on a recent colonoscopy was found to have a good size cecal polyp.  A biopsy was obtained which revealed high grade dysplasia.  She was referred for surgical management and right colectomy has been recommended and accepted using laparoscopic-assisted procedure.  The nature of the operation and the risks of bleeding and infection were discussed and understood preoperatively.  She is now brought to the operating room for the procedure.  DESCRIPTION OF PROCEDURE:  Following the mechanical and antibiotic bowel prep at home, the patient was brought to the operating room, and placed in the supine position on the operating table, and general endotracheal anesthesia was induced.  An orogastric tube and Foley catheter were placed.  She received broad spectrum antibiotics intravenously.  The abdomen was sterilely prepped and draped.  A 1 cm incision was made at the umbilicus and dissection carried down to the midline fascia which was sharply incised for 1 cm and the peritoneum entered under direct vision, through a mattress suture of 0 Vicryl being _______.  A trocar was inserted and pneumoperitoneum established.  The laparoscopy was unremarkable.  In the left upper quadrant, an 11 mm step trocar was placed.  With pneumoperitoneum through this trocar, the Hasson was withdrawn, and the pursestring suture secured.  A 7 cm periumbilical incision was made after placement of the  pneumo-sleeve.  A left hand was used through the pneumo-sleeve, and an additional 10 mm trocar was placed in the epigastrium.  The polyp could be palpated in the cecum.  The appendix was somewhat distended consistent with possibly a mucocele.  The remainder of the exploration was unremarkable.  The peritoneum was incised along the line of Toldt with the harmonic scalpel, and the cecum and right colon extensively mobilized ________ the retroperitoneum.  Some attachments on the terminal ileum were divided laterally, freeing the terminal ileum.  The right ureter was identified and carefully protected throughout the remainder of dissection. The hepatic flexure was mobilized, dividing the hepatic colic ligament with the harmonic scalpel.  The proximal transverse colon was then mobilized away from the stomach, dividing a portion of the lesser omentum.  After full mobilization of the right colon, the colon was brought out through the pneumo-sleeve incision with very good exposure of the mesentery and the bowel.  Points of proximal and distal resection at the terminal ileum and proximal transverse colon were chosen.  The mesentery between these two areas were then sequentially divided between clamps and tied with 2-0 silk ties. Larger vessels were doubly ligated.  After division of the mesentery, a functional end-to-end anastomosis was created between the terminal ileum and the transverse colon with a firing of the 75 mm GIA stapler.  The common enterotomy was closed and the specimen removed with a firing of the TA60.  The staple line had been inspected and there was no evidence of bleeding.  The mesenteric defect was closed with a  2-0 silk.  The bowel was returned to the abdomen and the pneumo-sleeve replaced.  A laparoscopy showed no evidence of bleeding.  The trocars were removed under direct vision and the pneumo-sleeve removed.  The fascia at the midline was closed with running #1 PDS.  The  skin was closed with staples.  The sponge, needle, and instrument counts were correct.  Dry sterile dressings were applied and the patient taken to the recovery room in good condition. DD:  03/12/01 TD:  03/13/01 Job: [id number]

## 2011-02-24 NOTE — Procedures (Signed)
[facility]  Patient:    Alison [name], Alison [name]                     MRN: [id number] Proc. Date: 02/11/01 Adm. Date:  [id number] Attending:  Louie Bun CC:         Willis Modena. Dreiling, M.D.   Procedure Report  PROCEDURE:  Colonoscopy with biopsy.  INDICATION FOR PROCEDURE:  Hemoccult positive stools and rectal bleeding.  DESCRIPTION OF PROCEDURE:  The patient was placed in the left lateral decubitus position and placed on the pulse monitor with continuous low-flow oxygen delivered by nasal cannula.  She was sedated with 60 mg IV Demerol and 6 mg IV Versed.  The Olympus video colonoscope was inserted into the rectum and advanced to the cecum, confirmed by transillumination at McBurneys point and visualization of the ileocecal valve and appendiceal orifice.  The prep was excellent.  In the base of the cecum, there was seen a soft sessile velvety mass consistent with a large villous adenoma with the possibility of carcinoma.  Multiple biopsies were taken of the lesion which was very soft and somewhat mobile but no discernible stalk could be identified.  There was a small 8 mm polyp just distal to the lesion which was not addressed.  The remainder of the cecum, ascending, transverse, descending, and sigmoid colon appeared normal without further masses, polyps, diverticula, or other mucosal abnormalities.  The rectum appeared normal, and retroflex view of the anus revealed no obvious internal hemorrhoids.  The colonoscope was then withdrawn and the patient returned to the recovery room in stable condition.  She tolerated the procedure well, and there were no immediate complications.  IMPRESSION:  A 5 cm soft polypoid cecal mass.  PLAN:  Await histology and will likely need surgical removal of this lesions. DD:  02/11/01 TD:  02/09/01 Job: [id number]

## 2011-02-24 NOTE — Op Note (Signed)
   NAMECAITLAND, Alison [name]                      ACCOUNT NO.:  [id number]   MEDICAL RECORD NO.:  [id number]                   PATIENT TYPE:  AMB   LOCATION:  ENDO                                 FACILITY:  [facility]   PHYSICIAN:  John C. Madilyn Fireman, M.D.                 DATE OF BIRTH:  06-Aug-1934   DATE OF PROCEDURE:  DATE OF DISCHARGE:                                 OPERATIVE REPORT   PROCEDURE:  Colonoscopy.   INDICATIONS FOR PROCEDURE:  History of large villous adenoma requiring  surgical resection one year ago.   DESCRIPTION OF PROCEDURE:  The patient was placed in the left lateral  decubitus position then placed on the pulse monitor with continuous low flow  oxygen delivered by nasal cannula. She was sedated with 62.5 mcg IV fentanyl  and 6 mg IV Versed. The Olympus video colonoscope was inserted into the  rectum and advanced to the ileocolonic anastomosis which was easily  identified. The ileal orifice was visualized but not explored. There was no  suggestion of any residual neoplasm at the anastomosis. The remaining  ascending colon as well as the transverse colon appeared normal with no  masses, polyps, diverticula or other mucosal abnormalities. In the  descending and sigmoid colon respectively, there were seen two sessile  polyps approximately 8 mm in diameter which were fulgurated by hot biopsy.  Otherwise the descending and sigmoid colon appeared normal. The rectum  likewise appeared normal and retroflexed view of the anus revealed no  obvious internal hemorrhoids. The scope was then withdrawn and the patient  returned to the recovery room in stable condition. She tolerated the  procedure well and there were no immediate complications.   IMPRESSION:  Descending and sigmoid colon polyps otherwise normal study.   PLAN:  Await histology and will probably repeat colonoscopy in 3-5 years.                                               John C. Madilyn Fireman, M.D.    JCH/MEDQ  D:   06/24/2003  T:  06/24/2003  Job:  [id number]   cc:   Lorne Skeens. Hoxworth, M.D.  [address], [address]  [place]  [place] [address]  Fax: [phone]

## 2011-03-07 ENCOUNTER — Telehealth: Payer: Self-pay | Admitting: Internal Medicine

## 2011-03-07 NOTE — Telephone Encounter (Signed)
Refill- amlodipine besylate 2.5 mg tab. Take 1 tablet by mouth every day. Qty 30. Last fill 4.22.12

## 2011-03-08 MED ORDER — AMLODIPINE BESYLATE 2.5 MG PO TABS: 2.5000 mg | ORAL_TABLET | Freq: Every day | ORAL | Status: DC

## 2011-03-08 NOTE — Telephone Encounter (Signed)
Pt has been advised that she needs to schedule a f/u around the end of June, (before current supply runs out) as she was due for f/u in March. Pt voices understanding.

## 2011-03-29 ENCOUNTER — Ambulatory Visit: Payer: Medicare Other | Admitting: Family

## 2011-04-03 ENCOUNTER — Ambulatory Visit: Payer: Medicare Other | Admitting: Internal Medicine

## 2011-04-04 ENCOUNTER — Ambulatory Visit (INDEPENDENT_AMBULATORY_CARE_PROVIDER_SITE_OTHER): Payer: Medicare Other | Admitting: Family

## 2011-04-04 ENCOUNTER — Encounter: Payer: Self-pay | Admitting: Family

## 2011-04-04 VITALS — BP 150/80 | HR 57 | Temp 98.0°F | Resp 18 | Ht 64.0 in | Wt 159.0 lb

## 2011-04-04 DIAGNOSIS — E042 Nontoxic multinodular goiter: Secondary | ICD-10-CM

## 2011-04-04 DIAGNOSIS — I10 Essential (primary) hypertension: Secondary | ICD-10-CM

## 2011-04-04 MED ORDER — AMLODIPINE BESYLATE 5 MG PO TABS: 5.0000 mg | ORAL_TABLET | Freq: Every day | ORAL | Status: DC

## 2011-04-04 MED ORDER — BISOPROLOL FUMARATE 5 MG PO TABS: ORAL_TABLET | ORAL | Status: DC

## 2011-04-04 NOTE — Assessment & Plan Note (Signed)
Will arrange follow up with Dr. Everardo All.

## 2011-04-04 NOTE — Patient Instructions (Addendum)
You will be contacted about your referral to Dr. Everardo All to check on your thyroid. Please schedule a Medicare wellness visit at the front desk for this summer- come fasting to this appointment.

## 2011-04-04 NOTE — Progress Notes (Signed)
  Subjective:    Patient ID: Alison [name], female    DOB: [birth year], 77 y.o.   MRN: [id number]  HPI  Patient presents today for followup.  1)  Hypertension- Patient has been treated for Chronic HTN for quiet sometime. She is currently on amlodipine, and well controlled. No associated S/S related to HTN.   Quality: chronic Modifying factor: meds Duration: Quite sometime Associated S/S: None.  The patient denies the following associated symptoms: Chest pain, dyspnea, blurred vision, headache, or lower extremity edema.  2) multinodular goiter-she has been seen by Dr. Everardo All in the past. It was recommended that she follow up in one year back in 2009. I do not see any followup notes.     Review of Systems See HPI   notes that she is at the end of a cold, mild nasal congestion only at this time.    Past Medical History  Diagnosis Date  . Hypertension   . External hemorrhoid   . Multinodular goiter   . Positional vertigo   . Pericardial effusion   . History of fibrocystic disease of breast   . Osteopenia   . Colon polyps     tubulovillous adenoma that requred hemicolectomy (Dr Madilyn Fireman)  . Thyroid nodule     right    History   Social History  . Marital Status: Married    Spouse Name: N/A    Number of Children: N/A  . Years of Education: N/A   Occupational History  . Retired     [employer]   Social History Main Topics  . Smoking status: Never Smoker   . Smokeless tobacco: Not on file  . Alcohol Use: No  . Drug Use: Not on file  . Sexually Active: Not on file   Other Topics Concern  . Not on file   Social History Narrative  . No narrative on file    Past Surgical History  Procedure Date  . Hemicolectomy   . Total abdominal hysterectomy w/ bilateral salpingoophorectomy 1984    Family History  Problem Relation Age of Onset  . Hyperlipidemia    . Hypertension    . Esophageal cancer      Allergies  Allergen Reactions  . Tetanus Toxoid      Current Outpatient Prescriptions on File Prior to Visit  Medication Sig Dispense Refill  . DISCONTD: amLODipine (NORVASC) 2.5 MG tablet Take 1 tablet (2.5 mg total) by mouth daily.  30 tablet  0  . DISCONTD: bisoprolol (ZEBETA) 5 MG tablet TAKE 1/2 TABLET BY MOUTH EVERY DAY  45 tablet  3    BP 150/80  Pulse 57  Temp(Src) 98 F (36.7 C) (Oral)  Resp 18  Ht 5\' 4"  (1.626 m)  Wt 159 lb (72.122 kg)  BMI 27.29 kg/m2    Objective:   Physical Exam  Constitutional: She is oriented to person, place, and time. She appears well-developed and well-nourished.  Cardiovascular: Normal rate and regular rhythm.   Pulmonary/Chest: Effort normal and breath sounds normal.  Musculoskeletal: Normal range of motion. She exhibits no edema and no tenderness.  Neurological: She is alert and oriented to person, place, and time.  Skin: Skin is warm and dry.  Psychiatric: She has a normal mood and affect. Her behavior is normal. Judgment and thought content normal.          Assessment & Plan:

## 2011-04-04 NOTE — Assessment & Plan Note (Signed)
BP Readings from Last 3 Encounters:  04/04/11 150/80  11/23/10 130/74  10/26/10 122/70  BP is up a bit today.  Will increase her amlodipine from 2.5 to 5 mg once daily. She is to schedule a follow up appointment this summer for a medicare wellness visit. Plan to check laboratories at that time.

## 2011-04-06 ENCOUNTER — Telehealth: Payer: Self-pay | Admitting: *Deleted

## 2011-04-06 NOTE — Telephone Encounter (Signed)
Spoke to pt, she declines appointment to Endocrinology at present. She states she wants to wait a while as they have too many appointments at present. She is also hesitant to follow up as she states she became sick after her last thyroid u/s. Reports nearly fainting in the office afterwards and felt very bad. She is afraid to proceed with additional imaging.

## 2011-04-07 NOTE — Telephone Encounter (Signed)
Pls ask pt to call us when she is able to reschedule.  I do feel that it is important that she follow through with this.

## 2011-04-07 NOTE — Telephone Encounter (Signed)
Pt returned my call and was notified per Melissa's instructions. Pt states she will call us back.

## 2011-04-07 NOTE — Telephone Encounter (Signed)
Left message on machine to return my call. 

## 2011-05-29 LAB — HM MAMMOGRAPHY: HM Mammogram: NORMAL

## 2011-06-14 ENCOUNTER — Encounter: Payer: Self-pay | Admitting: Family

## 2011-06-14 ENCOUNTER — Ambulatory Visit (INDEPENDENT_AMBULATORY_CARE_PROVIDER_SITE_OTHER): Payer: Medicare Other | Admitting: Family

## 2011-06-14 VITALS — BP 122/80 | HR 60 | Temp 97.9°F | Resp 16 | Ht 64.0 in | Wt 161.0 lb

## 2011-06-14 DIAGNOSIS — M899 Disorder of bone, unspecified: Secondary | ICD-10-CM

## 2011-06-14 DIAGNOSIS — R82998 Other abnormal findings in urine: Secondary | ICD-10-CM

## 2011-06-14 DIAGNOSIS — I10 Essential (primary) hypertension: Secondary | ICD-10-CM

## 2011-06-14 DIAGNOSIS — R3129 Other microscopic hematuria: Secondary | ICD-10-CM

## 2011-06-14 DIAGNOSIS — E042 Nontoxic multinodular goiter: Secondary | ICD-10-CM

## 2011-06-14 DIAGNOSIS — R8290 Unspecified abnormal findings in urine: Secondary | ICD-10-CM

## 2011-06-14 DIAGNOSIS — Z0000 Encounter for general adult medical examination without abnormal findings: Secondary | ICD-10-CM

## 2011-06-14 DIAGNOSIS — Z136 Encounter for screening for cardiovascular disorders: Secondary | ICD-10-CM

## 2011-06-14 LAB — POCT URINALYSIS DIPSTICK
Bilirubin, UA: NEGATIVE
Glucose, UA: NEGATIVE
Spec Grav, UA: 1.02

## 2011-06-14 LAB — BASIC METABOLIC PANEL
CO2: 22 mEq/L (ref 19–32)
Chloride: 106 mEq/L (ref 96–112)
Sodium: 139 mEq/L (ref 135–145)

## 2011-06-14 LAB — LIPID PANEL
LDL Cholesterol: 118 mg/dL — ABNORMAL HIGH (ref 0–99)
Total CHOL/HDL Ratio: 4.2 Ratio
VLDL: 34 mg/dL (ref 0–40)

## 2011-06-14 MED ORDER — TRIAMCINOLONE ACETONIDE 0.5 % EX CREA: 1.0000 "application " | TOPICAL_CREAM | CUTANEOUS | Status: DC | PRN

## 2011-06-14 MED ORDER — AMLODIPINE BESYLATE 5 MG PO TABS: 5.0000 mg | ORAL_TABLET | Freq: Every day | ORAL | Status: DC

## 2011-06-14 MED ORDER — CALCIUM CARBONATE-VITAMIN D 600-400 MG-UNIT PO TABS: 1.0000 | ORAL_TABLET | Freq: Two times a day (BID) | ORAL | Status: DC

## 2011-06-14 NOTE — Progress Notes (Signed)
Subjective:    Patient ID: Alison [name], female    DOB: 20-Feb-1934, 77 y.o.   MRN: [id number]  HPI Subjective:   Patient here for Medicare annual wellness visit and management of other chronic and acute problems.   Multinodular goiter-  She declines ultrasound  HTN-  Patient is taking amlodipine and bisoprolol- tolerating without difficulty.  GERD- reports that this is well controlled.  Urine is foul smelling- several weeks, denies dysuria, fever, or frequency.  Denies hematuria. Denies low back pain.    Osteopenia- Walks daily.  Not taking a calcium supplement regularly.  Risk factors:   At risk for fracture At risk for thyroid cancer  Roster of Physicians Providing Medical Care to Patient: [employer]- as needed for arthritis  Activities of Daily Living  In your present state of health, do you have any difficulty performing the following activities? Preparing food and eating?: No  Bathing yourself: No  Getting dressed: No  Using the toilet:No  Moving around from place to place: No  In the past year have you fallen or had a near fall?:No    Home Safety: Has smoke detector and wears seat belts. Has firearm in the home which is under lock. No excess sun exposure.  Diet and Exercise  Current exercise habits: daily walking Dietary issues discussed: healthy diet- lots of veggies/meats.  Depression Screen  (Note: if answer to either of the following is "Yes", then a more complete depression screening is indicated)  Q1: Over the past two weeks, have you felt down, depressed or hopeless?no  Q2: Over the past two weeks, have you felt little interest or pleasure in doing things? no   The following portions of the patient's history were reviewed and updated as appropriate: allergies, current medications, past family history, past medical history, past social history, past surgical history and problem list.    Objective:   Vision:see nursing Hearing:  Able to hear  forced whisper at 6 feet Body mass index: see nursing Cognitive Impairment Assessment: cognition, memory and judgment appear normal.  Past Medical History  Diagnosis Date  . Hypertension   . External hemorrhoid   . Multinodular goiter   . Positional vertigo   . Pericardial effusion   . History of fibrocystic disease of breast   . Osteopenia   . Colon polyps     tubulovillous adenoma that requred hemicolectomy (Dr Madilyn Fireman)  . Thyroid nodule     right    History   Social History  . Marital Status: Married    Spouse Name: N/A    Number of Children: N/A  . Years of Education: N/A   Occupational History  . Retired     [employer]   Social History Main Topics  . Smoking status: Never Smoker   . Smokeless tobacco: Not on file  . Alcohol Use: No  . Drug Use: Not on file  . Sexually Active: Not on file   Other Topics Concern  . Not on file   Social History Narrative  . No narrative on file    Past Surgical History  Procedure Date  . Hemicolectomy   . Total abdominal hysterectomy w/ bilateral salpingoophorectomy 1984    Family History  Problem Relation Age of Onset  . Hyperlipidemia    . Hypertension    . Esophageal cancer      Allergies  Allergen Reactions  . Tetanus Toxoid     Current Outpatient Prescriptions on File Prior to Visit  Medication Sig  Dispense Refill  . amLODipine (NORVASC) 5 MG tablet Take 1 tablet (5 mg total) by mouth daily.  30 tablet  2  . bisoprolol (ZEBETA) 5 MG tablet 1/2 tab once daily   45 tablet  1    BP 122/80  Pulse 60  Temp(Src) 97.9 F (36.6 C) (Oral)  Resp 16  Ht 5\' 4"  (1.626 m)  Wt 161 lb 0.6 oz (73.047 kg)  BMI 27.64 kg/m2    Assessment:   Medicare wellness utd on preventive parameters  Last mammogram 06/14/11 Last dexa 08/05/10 Last colo- 2010 Tetanus- had allergic reaction Flu shot- declines Pneumovax- declines Zostavax- declines   Plan:   During the course of the visit the patient was educated and  counseled about appropriate screening and preventive services including:  Fall prevention  Vaccines / LABS Zostavax / Pnemonccoal Vaccine- refuses  Patient Instructions (the written plan) was given to the patient.       Review of Systems  Constitutional: Negative for fever.  HENT: Negative for congestion.   Eyes: Negative for visual disturbance.  Respiratory: Negative for chest tightness and shortness of breath.   Cardiovascular: Negative for chest pain.  Gastrointestinal: Negative for vomiting and diarrhea.  Genitourinary: Negative for urgency and frequency.  Musculoskeletal: Negative for arthralgias.  Skin: Negative for rash.  Neurological: Negative for dizziness and headaches.  Hematological: Negative for adenopathy.  Psychiatric/Behavioral:       Denies depression       Objective:   Physical Exam  Constitutional: She appears well-developed and well-nourished.  HENT:  Head: Normocephalic and atraumatic.  Eyes: Pupils are equal, round, and reactive to light.       Some hyperpigmentation of sclera noted, probable cataracts noted.  Neck: Neck supple. No thyromegaly present.  Cardiovascular: Normal rate and regular rhythm.   No murmur heard. Pulmonary/Chest: Effort normal.  Abdominal: Soft. Bowel sounds are normal. She exhibits no distension and no mass. There is no tenderness. There is no rebound and no guarding.  Genitourinary:       Bilateral breast exam is normal without masses or axillary LAD noted.  GYN exam deferred (s/p TAH/BSO)  Skin: Skin is warm and dry.  Psychiatric: She has a normal mood and affect. Her speech is normal and behavior is normal. Judgment and thought content normal. Cognition and memory are normal.          Assessment & Plan:

## 2011-06-14 NOTE — Assessment & Plan Note (Signed)
Reviewed with pt- she had her last ultrasound back in 2009 (prior to that she had a neg thryoid biopsy).  I recommended a follow up ultrasound to ensure stability.  We discussed the possibility of thyroid nodules progressing to cancer.  She verbalizes understanding and refuses further work up at this time.

## 2011-06-14 NOTE — Patient Instructions (Signed)
Please complete your blood work on the first floor.  Follow up in 3 months.

## 2011-06-14 NOTE — Assessment & Plan Note (Signed)
Will add Caltrate BID.  Encouraged her to continue her exercise.

## 2011-06-14 NOTE — Assessment & Plan Note (Signed)
UA reviewed, unremarkable except for trace blood and trace blood- (not new for pt).  Send urine for culture and will plan to treat if +.

## 2011-06-14 NOTE — Assessment & Plan Note (Signed)
BP stable. Continue current meds.   

## 2011-06-16 LAB — URINE CULTURE: Organism ID, Bacteria: NO GROWTH

## 2011-06-18 ENCOUNTER — Telehealth: Payer: Self-pay | Admitting: Family

## 2011-06-18 DIAGNOSIS — R739 Hyperglycemia, unspecified: Secondary | ICD-10-CM

## 2011-06-18 NOTE — Telephone Encounter (Signed)
Please call lab and see if they can add on an A1C to her blood work.  Diagnosis hyperglycemia.

## 2011-06-19 NOTE — Telephone Encounter (Signed)
They will be unable to add A1C as no lavender tube was drawn.

## 2011-06-19 NOTE — Telephone Encounter (Signed)
Could you pls call pt and let her know that her sugar is elevated and that I would like for her to complete an additional test A1C in the lab to further evaluate.  Diagnosis Hyperglycemia.

## 2011-06-20 NOTE — Telephone Encounter (Signed)
Pt notified and states she will return to the lab on Monday morning. Order has been entered and forwarded to the lab.

## 2011-06-26 LAB — HEMOGLOBIN A1C
Hgb A1c MFr Bld: 6 % — ABNORMAL HIGH (ref <5.7)
Mean Plasma Glucose: 126 mg/dL — ABNORMAL HIGH (ref <117)

## 2011-06-28 ENCOUNTER — Other Ambulatory Visit: Payer: Self-pay | Admitting: Family

## 2011-06-30 ENCOUNTER — Telehealth: Payer: Self-pay | Admitting: Family

## 2011-06-30 ENCOUNTER — Encounter: Payer: Self-pay | Admitting: Family

## 2011-06-30 DIAGNOSIS — R7303 Prediabetes: Secondary | ICD-10-CM

## 2011-06-30 NOTE — Assessment & Plan Note (Signed)
A1C-6.0

## 2011-06-30 NOTE — Telephone Encounter (Signed)
Pls call pt and let her know that her blood work shows borderline diabetes.  She should avoid concentrated sweets, eat smaller portions of carbohydrates and continue regular exercise. Follow up in 3 months.

## 2011-07-03 NOTE — Telephone Encounter (Signed)
Pt notified and transferred to Marj to schedule f/u.

## 2011-09-18 ENCOUNTER — Ambulatory Visit (INDEPENDENT_AMBULATORY_CARE_PROVIDER_SITE_OTHER): Payer: Medicare Other | Admitting: Family

## 2011-09-18 ENCOUNTER — Encounter: Payer: Self-pay | Admitting: Family

## 2011-09-18 VITALS — BP 124/70 | HR 60 | Temp 97.8°F | Resp 16 | Ht 64.0 in | Wt 159.1 lb

## 2011-09-18 DIAGNOSIS — R7309 Other abnormal glucose: Secondary | ICD-10-CM

## 2011-09-18 DIAGNOSIS — R7303 Prediabetes: Secondary | ICD-10-CM

## 2011-09-18 DIAGNOSIS — H8110 Benign paroxysmal vertigo, unspecified ear: Secondary | ICD-10-CM

## 2011-09-18 DIAGNOSIS — I10 Essential (primary) hypertension: Secondary | ICD-10-CM

## 2011-09-18 DIAGNOSIS — E042 Nontoxic multinodular goiter: Secondary | ICD-10-CM

## 2011-09-18 LAB — BASIC METABOLIC PANEL
Calcium: 9.1 mg/dL (ref 8.4–10.5)
Creat: 0.89 mg/dL (ref 0.50–1.10)

## 2011-09-18 LAB — HEMOGLOBIN A1C: Mean Plasma Glucose: 126 mg/dL — ABNORMAL HIGH (ref <117)

## 2011-09-18 MED ORDER — MECLIZINE HCL 12.5 MG PO TABS: 12.5000 mg | ORAL_TABLET | Freq: Three times a day (TID) | ORAL | Status: AC | PRN

## 2011-09-18 NOTE — Assessment & Plan Note (Addendum)
Obtain A1C. She declines flu shot.

## 2011-09-18 NOTE — Assessment & Plan Note (Signed)
BP Readings from Last 3 Encounters:  09/18/11 124/70  06/14/11 122/80  04/04/11 150/80   His blood pressure is stable on bisoprolol and amlodipine, continue same.

## 2011-09-18 NOTE — Assessment & Plan Note (Signed)
Deteriorated.  Will give trial of PRN meclizine. If no improvement plan to refer back for vestibular rehab which helped her back in 2009.

## 2011-09-18 NOTE — Progress Notes (Signed)
Subjective:    Patient ID: Alison [name], female    DOB: 07/18/1934, 77 y.o.   MRN: [id number]  HPI  Ms.  Alison [name] is a 77 yr old female who presents today for follow up.  1)Patient presents today for followup of hypertension. Patient has been treated for Chronic HTN for quiet sometime. She is currently on bisoprolol, and amlodipine, and well controlled. No associated S/S related to HTN.   Quality: chronic Modifying factor: meds Duration: Quite sometime Associated S/S: None.  2)  Hx Vertigo- she reports that she has hx of vertigo.  Notes that this is not a severe as it has been in the past.  She reports that she underwent physical therapy and was treated with some medication as needed which helped.  Notes that she recently has felt off balance when she stands up.    3) Hx of multinodular goiter- she declined endocrinology referral back in the summer. She has seen Dr. Everardo All.  Review of Systems  Cardiovascular: Negative for leg swelling.  Neurological: Positive for dizziness.    See HPI  Past Medical History  Diagnosis Date  . Hypertension   . External hemorrhoid   . Multinodular goiter   . Positional vertigo   . Pericardial effusion   . History of fibrocystic disease of breast   . Osteopenia   . Colon polyps     tubulovillous adenoma that requred hemicolectomy (Dr Madilyn Fireman)  . Thyroid nodule     right  . Borderline diabetes 06/30/2011    History   Social History  . Marital Status: Married    Spouse Name: N/A    Number of Children: N/A  . Years of Education: N/A   Occupational History  . Retired     [employer]   Social History Main Topics  . Smoking status: Never Smoker   . Smokeless tobacco: Not on file  . Alcohol Use: No  . Drug Use: Not on file  . Sexually Active: Not on file   Other Topics Concern  . Not on file   Social History Narrative  . No narrative on file    Past Surgical History  Procedure Date  . Hemicolectomy   . Total abdominal  hysterectomy w/ bilateral salpingoophorectomy 1984    Family History  Problem Relation Age of Onset  . Hyperlipidemia    . Hypertension    . Esophageal cancer      Allergies  Allergen Reactions  . Tetanus Toxoid     Current Outpatient Prescriptions on File Prior to Visit  Medication Sig Dispense Refill  . amLODipine (NORVASC) 5 MG tablet Take 1 tablet (5 mg total) by mouth daily.  30 tablet  2  . bisoprolol (ZEBETA) 5 MG tablet 1/2 tab once daily   45 tablet  1  . Calcium Carbonate-Vitamin D (CALTRATE 600+D) 600-400 MG-UNIT per tablet Take 1 tablet by mouth 2 (two) times daily.      Marland Kitchen triamcinolone (KENALOG) 0.5 % cream Apply 1 application topically as needed. For hand rash  30 g  0    BP 124/70  Pulse 60  Temp(Src) 97.8 F (36.6 C) (Oral)  Resp 16  Ht 5\' 4"  (1.626 m)  Wt 159 lb 1.3 oz (72.158 kg)  BMI 27.31 kg/m2     Objective:   Physical Exam  Constitutional: She is oriented to person, place, and time. She appears well-developed and well-nourished.  HENT:  Head: Normocephalic and atraumatic.  Neck: Normal range of motion. Neck supple.  Cardiovascular: Normal rate and regular rhythm.   No murmur heard. Pulmonary/Chest: Effort normal and breath sounds normal. No respiratory distress. She has no wheezes. She has no rales. She exhibits no tenderness.  Abdominal: Soft. Bowel sounds are normal.  Musculoskeletal: She exhibits no edema.  Neurological: She is oriented to person, place, and time. She exhibits normal muscle tone. Coordination normal.  Psychiatric: She has a normal mood and affect. Her behavior is normal. Judgment and thought content normal.          Assessment & Plan:

## 2011-09-18 NOTE — Assessment & Plan Note (Signed)
Will try to reschedule follow up apt with Dr. Everardo All. Pt tells me that she is agreeable to proceed with follow up apt.

## 2011-09-18 NOTE — Patient Instructions (Signed)
Call if your dizziness worsens, or if no improvement with the Meclizine tablets.  Follow up in 6 weeks.

## 2011-09-19 ENCOUNTER — Encounter: Payer: Self-pay | Admitting: Family

## 2011-09-25 ENCOUNTER — Ambulatory Visit: Payer: Medicare Other | Admitting: Endocrinology

## 2011-10-06 ENCOUNTER — Other Ambulatory Visit: Payer: Self-pay | Admitting: Family

## 2011-10-17 ENCOUNTER — Other Ambulatory Visit: Payer: Self-pay | Admitting: Family

## 2011-10-23 ENCOUNTER — Encounter: Payer: Self-pay | Admitting: Endocrinology

## 2011-10-23 ENCOUNTER — Ambulatory Visit (INDEPENDENT_AMBULATORY_CARE_PROVIDER_SITE_OTHER): Payer: Medicare Other | Admitting: Endocrinology

## 2011-10-23 VITALS — BP 112/70 | HR 57 | Temp 97.6°F | Ht 64.0 in | Wt 164.0 lb

## 2011-10-23 DIAGNOSIS — E042 Nontoxic multinodular goiter: Secondary | ICD-10-CM

## 2011-10-23 NOTE — Progress Notes (Signed)
  Subjective:    Patient ID: Alison [name], female    DOB: March 23, 1934, 78 y.o.   MRN: [id number]  HPI Pt was incidentally noted on chest ct 4 years ago, to have a right thyroid nodule.  bx in 2008 was benign.  Last [place] was in 2009.  It showed multinodular goiter, with the largest nodule (on the right) to be only slightly changed in size, if at all.  Pt says she does not notice the nodule.   No assoc dysphagia.   Past Medical History  Diagnosis Date  . Hypertension   . External hemorrhoid   . Multinodular goiter   . Positional vertigo   . Pericardial effusion   . History of fibrocystic disease of breast   . Osteopenia   . Colon polyps     tubulovillous adenoma that requred hemicolectomy (Dr Madilyn Fireman)  . Thyroid nodule     right  . Borderline diabetes 06/30/2011    Past Surgical History  Procedure Date  . Hemicolectomy   . Total abdominal hysterectomy w/ bilateral salpingoophorectomy 1984    History   Social History  . Marital Status: Married    Spouse Name: N/A    Number of Children: N/A  . Years of Education: N/A   Occupational History  . Retired     [employer]   Social History Main Topics  . Smoking status: Never Smoker   . Smokeless tobacco: Not on file  . Alcohol Use: No  . Drug Use: Not on file  . Sexually Active: Not on file   Other Topics Concern  . Not on file   Social History Narrative  . No narrative on file    Current Outpatient Prescriptions on File Prior to Visit  Medication Sig Dispense Refill  . amLODipine (NORVASC) 5 MG tablet Take 1 tablet (5 mg total) by mouth daily.  30 tablet  2  . bisoprolol (ZEBETA) 5 MG tablet TAKE 1/2 TABLET BY MOUTH DAILY  45 tablet  1  . Calcium Carbonate-Vitamin D (CALTRATE 600+D) 600-400 MG-UNIT per tablet Take 1 tablet by mouth 2 (two) times daily.      Marland Kitchen triamcinolone (KENALOG) 0.5 % cream Apply 1 application topically as needed. For hand rash  30 g  0    Allergies  Allergen Reactions  . Tetanus Toxoid      Family History  Problem Relation Age of Onset  . Hyperlipidemia    . Hypertension    . Esophageal cancer    no goiter or other thyroid probs  BP 112/70  Pulse 57  Temp(Src) 97.6 F (36.4 C) (Oral)  Ht 5\' 4"  (1.626 m)  Wt 164 lb (74.39 kg)  BMI 28.15 kg/m2  SpO2 99%  Review of Systems Denies sob and neck pain    Objective:   Physical Exam VITAL SIGNS:  See vs page GENERAL: no distress Neck: i feel what is perhaps the top of the right thyroid nodule, but i cannot be certain.  No lymphadenopathy.     Lab Results  Component Value Date   TSH 1.448 06/14/2011      Assessment & Plan:  Multinodular goiter, which is usually hereditary.  Due for f/u US.

## 2011-10-23 NOTE — Patient Instructions (Signed)
Let's recheck the ultrasound.  you will receive a phone call, about a day and time for an appointment.  1-2 days later, please call [phone] to hear your test results.  You will be prompted to enter the 9-digit "MRN" number that appears at the top left of this page, followed by #.  Then you will hear the message. most of the time, a "lumpy thyroid" will eventually become overactive.  this is usually a slow process, happening over the span of many years. Please return here in approx 2 years.

## 2011-10-30 ENCOUNTER — Ambulatory Visit: Payer: Medicare Other | Admitting: Family

## 2011-11-06 ENCOUNTER — Ambulatory Visit
Admission: RE | Admit: 2011-11-06 | Discharge: 2011-11-06 | Disposition: A | Discharge status: Home / Self Care | Payer: Medicare Other | Source: Ambulatory Visit | Attending: Endocrinology | Admitting: Endocrinology

## 2011-11-06 DIAGNOSIS — E042 Nontoxic multinodular goiter: Secondary | ICD-10-CM

## 2011-11-20 ENCOUNTER — Telehealth: Payer: Self-pay | Admitting: *Deleted

## 2011-11-20 NOTE — Telephone Encounter (Signed)
Pt called requesting results of thyroid ultrasound. Please advise.

## 2011-11-20 NOTE — Telephone Encounter (Signed)
Pt informed results on phone tree and given information to access phone tree.

## 2011-11-20 NOTE — Telephone Encounter (Signed)
On phone-tree 

## 2011-11-20 NOTE — Telephone Encounter (Signed)
Dr. Everardo All ordered US, I will forward this request to him.

## 2011-12-03 ENCOUNTER — Other Ambulatory Visit: Payer: Self-pay | Admitting: Family

## 2011-12-04 ENCOUNTER — Telehealth: Payer: Self-pay | Admitting: Family

## 2011-12-04 MED ORDER — MECLIZINE HCL 12.5 MG PO TABS: 12.5000 mg | ORAL_TABLET | Freq: Three times a day (TID) | ORAL | Status: AC | PRN

## 2011-12-04 NOTE — Telephone Encounter (Signed)
Pt came in with husband for his apt. Requests refill of meclizine which she has had in past for vertigo- though it is not bothering her. Wants to have on hand.

## 2011-12-11 ENCOUNTER — Other Ambulatory Visit (HOSPITAL_BASED_OUTPATIENT_CLINIC_OR_DEPARTMENT_OTHER): Payer: Self-pay | Admitting: Family Medicine

## 2011-12-11 DIAGNOSIS — R52 Pain, unspecified: Secondary | ICD-10-CM

## 2011-12-12 ENCOUNTER — Ambulatory Visit (HOSPITAL_BASED_OUTPATIENT_CLINIC_OR_DEPARTMENT_OTHER)
Admission: RE | Admit: 2011-12-12 | Discharge: 2011-12-12 | Disposition: A | Discharge status: Home / Self Care | Payer: Medicare Other | Source: Ambulatory Visit | Attending: Family Medicine | Admitting: Family Medicine

## 2011-12-12 DIAGNOSIS — R609 Edema, unspecified: Secondary | ICD-10-CM

## 2011-12-12 DIAGNOSIS — R52 Pain, unspecified: Secondary | ICD-10-CM

## 2011-12-12 DIAGNOSIS — M25469 Effusion, unspecified knee: Secondary | ICD-10-CM | POA: Insufficient documentation

## 2011-12-12 DIAGNOSIS — M23329 Other meniscus derangements, posterior horn of medial meniscus, unspecified knee: Secondary | ICD-10-CM | POA: Insufficient documentation

## 2011-12-12 DIAGNOSIS — R262 Difficulty in walking, not elsewhere classified: Secondary | ICD-10-CM | POA: Insufficient documentation

## 2011-12-12 DIAGNOSIS — M7120 Synovial cyst of popliteal space [Baker], unspecified knee: Secondary | ICD-10-CM | POA: Insufficient documentation

## 2011-12-12 DIAGNOSIS — M25559 Pain in unspecified hip: Secondary | ICD-10-CM | POA: Insufficient documentation

## 2011-12-14 ENCOUNTER — Other Ambulatory Visit (HOSPITAL_COMMUNITY): Payer: Self-pay | Admitting: Orthopaedic Surgery

## 2011-12-14 ENCOUNTER — Other Ambulatory Visit: Payer: Self-pay | Admitting: Family

## 2011-12-14 NOTE — Telephone Encounter (Signed)
Refill sent to pharmacy for bisoprolol #45 x no refills.

## 2011-12-18 ENCOUNTER — Encounter (HOSPITAL_COMMUNITY): Payer: Self-pay | Admitting: Pharmacy Technician

## 2011-12-21 ENCOUNTER — Ambulatory Visit (HOSPITAL_COMMUNITY)
Admission: RE | Admit: 2011-12-21 | Discharge: 2011-12-21 | Disposition: A | Discharge status: Home / Self Care | Payer: Medicare Other | Source: Ambulatory Visit | Attending: Orthopaedic Surgery | Admitting: Orthopaedic Surgery

## 2011-12-21 ENCOUNTER — Encounter (HOSPITAL_COMMUNITY): Payer: Self-pay

## 2011-12-21 ENCOUNTER — Encounter (HOSPITAL_COMMUNITY)
Admission: RE | Admit: 2011-12-21 | Discharge: 2011-12-21 | Disposition: A | Discharge status: Home / Self Care | Payer: Medicare Other | Source: Ambulatory Visit | Attending: Orthopaedic Surgery | Admitting: Orthopaedic Surgery

## 2011-12-21 HISTORY — DX: Unspecified osteoarthritis, unspecified site: M19.90

## 2011-12-21 LAB — BASIC METABOLIC PANEL
BUN: 17 mg/dL (ref 6–23)
Calcium: 9.7 mg/dL (ref 8.4–10.5)
Creatinine, Ser: 0.82 mg/dL (ref 0.50–1.10)
GFR calc Af Amer: 79 mL/min — ABNORMAL LOW (ref >90)
GFR calc non Af Amer: 68 mL/min — ABNORMAL LOW (ref >90)
Glucose, Bld: 91 mg/dL (ref 70–99)

## 2011-12-21 LAB — CBC
HCT: 42.9 % (ref 36.0–46.0)
MCH: 29.9 pg (ref 26.0–34.0)
MCHC: 33.6 g/dL (ref 30.0–36.0)
MCV: 89 fL (ref 78.0–100.0)
RDW: 12.8 % (ref 11.5–15.5)

## 2011-12-21 NOTE — Pre-Procedure Instructions (Signed)
06/14/11 EKG in EPIC  ECHO 2008 in [facility]  Holter 1/12 EPIC under MEDIA  LOV with PCP 10/23/11 Dr Romero Belling

## 2011-12-21 NOTE — Patient Instructions (Signed)
20 Alison [name]  12/21/2011   Your procedure is scheduled on:  12/22/11 200pm-255pm  Report to [facility] at  1130  AM.  Call this number if you have problems the morning of surgery: [phone]   Remember:   Do not eat food:After Midnight.  May have clear liquids:until Midnight .   Take these medicines the morning of surgery with A SIP OF WATER:    Do not wear jewelry, make-up or nail polish.  Do not wear lotions, powders, or perfumes.   Do not shave 48 hours prior to surgery.  Do not bring valuables to the hospital.  Contacts, dentures or bridgework may not be worn into surgery.     Patients discharged the day of surgery will not be allowed to drive home.  Name and phone number of your driver  Special Instructions: CHG Shower Use Special Wash: 1/2 bottle night before surgery and 1/2 bottle morning of surgery. shower chin to toes with CHG.  Wash face and private parts with regular soap.     Please read over the following fact sheets that you were given: MRSA Information, coughing and deep breathing exercises, leg exercises

## 2011-12-22 ENCOUNTER — Ambulatory Visit (HOSPITAL_COMMUNITY)
Admission: RE | Admit: 2011-12-22 | Discharge: 2011-12-22 | Disposition: A | Discharge status: Home / Self Care | Payer: Medicare Other | Source: Ambulatory Visit | Attending: Orthopaedic Surgery | Admitting: Orthopaedic Surgery

## 2011-12-22 ENCOUNTER — Encounter (HOSPITAL_COMMUNITY): Payer: Self-pay | Admitting: *Deleted

## 2011-12-22 ENCOUNTER — Ambulatory Visit (HOSPITAL_COMMUNITY): Payer: Medicare Other | Admitting: Anesthesiology

## 2011-12-22 ENCOUNTER — Encounter (HOSPITAL_COMMUNITY)
Admission: RE | Disposition: A | Discharge status: Home / Self Care | Payer: Self-pay | Source: Ambulatory Visit | Attending: Orthopaedic Surgery

## 2011-12-22 ENCOUNTER — Encounter (HOSPITAL_COMMUNITY): Payer: Self-pay | Admitting: Anesthesiology

## 2011-12-22 DIAGNOSIS — M659 Unspecified synovitis and tenosynovitis, unspecified site: Secondary | ICD-10-CM | POA: Insufficient documentation

## 2011-12-22 DIAGNOSIS — Z01812 Encounter for preprocedural laboratory examination: Secondary | ICD-10-CM | POA: Insufficient documentation

## 2011-12-22 DIAGNOSIS — M1710 Unilateral primary osteoarthritis, unspecified knee: Secondary | ICD-10-CM | POA: Insufficient documentation

## 2011-12-22 DIAGNOSIS — M23329 Other meniscus derangements, posterior horn of medial meniscus, unspecified knee: Secondary | ICD-10-CM | POA: Insufficient documentation

## 2011-12-22 DIAGNOSIS — Z79899 Other long term (current) drug therapy: Secondary | ICD-10-CM | POA: Insufficient documentation

## 2011-12-22 DIAGNOSIS — I10 Essential (primary) hypertension: Secondary | ICD-10-CM | POA: Insufficient documentation

## 2011-12-22 DIAGNOSIS — S83206A Unspecified tear of unspecified meniscus, current injury, right knee, initial encounter: Secondary | ICD-10-CM

## 2011-12-22 DIAGNOSIS — M2240 Chondromalacia patellae, unspecified knee: Secondary | ICD-10-CM | POA: Insufficient documentation

## 2011-12-22 SURGERY — ARTHROSCOPY, KNEE, WITH MEDIAL MENISCECTOMY
Anesthesia: General | Site: Knee | Laterality: Right | Wound class: Clean

## 2011-12-22 MED ORDER — LACTATED RINGERS IR SOLN
Status: DC | PRN
  Administered 2011-12-22: 3000 mL

## 2011-12-22 MED ORDER — MORPHINE SULFATE 4 MG/ML IJ SOLN
INTRAMUSCULAR | Status: AC
  Filled 2011-12-22: qty 1

## 2011-12-22 MED ORDER — MORPHINE SULFATE 4 MG/ML IJ SOLN
INTRAMUSCULAR | Status: DC | PRN
  Administered 2011-12-22: 3 mg

## 2011-12-22 MED ORDER — MIDAZOLAM HCL 5 MG/5ML IJ SOLN
INTRAMUSCULAR | Status: DC | PRN
  Administered 2011-12-22: 2 mg via INTRAVENOUS

## 2011-12-22 MED ORDER — DEXAMETHASONE SODIUM PHOSPHATE 10 MG/ML IJ SOLN
INTRAMUSCULAR | Status: DC | PRN
  Administered 2011-12-22: 10 mg via INTRAVENOUS

## 2011-12-22 MED ORDER — CEFAZOLIN SODIUM 1-5 GM-% IV SOLN
INTRAVENOUS | Status: AC
  Filled 2011-12-22: qty 50

## 2011-12-22 MED ORDER — BUPIVACAINE HCL (PF) 0.5 % IJ SOLN
INTRAMUSCULAR | Status: AC
  Filled 2011-12-22: qty 30

## 2011-12-22 MED ORDER — ACETAMINOPHEN 10 MG/ML IV SOLN
INTRAVENOUS | Status: DC | PRN
  Administered 2011-12-22: 1000 mg via INTRAVENOUS

## 2011-12-22 MED ORDER — FENTANYL CITRATE 0.05 MG/ML IJ SOLN: 25.0000 ug | INTRAMUSCULAR | Status: DC | PRN

## 2011-12-22 MED ORDER — CEFAZOLIN SODIUM 1-5 GM-% IV SOLN
1.0000 g | INTRAVENOUS | Status: AC
  Administered 2011-12-22: 1 g via INTRAVENOUS

## 2011-12-22 MED ORDER — ACETAMINOPHEN 10 MG/ML IV SOLN
INTRAVENOUS | Status: AC
  Filled 2011-12-22: qty 100

## 2011-12-22 MED ORDER — LIDOCAINE HCL (CARDIAC) 20 MG/ML IV SOLN
INTRAVENOUS | Status: DC | PRN
  Administered 2011-12-22: 50 mg via INTRAVENOUS

## 2011-12-22 MED ORDER — BUPIVACAINE HCL (PF) 0.5 % IJ SOLN
INTRAMUSCULAR | Status: DC | PRN
  Administered 2011-12-22: 20 mL via INTRA_ARTICULAR

## 2011-12-22 MED ORDER — FENTANYL CITRATE 0.05 MG/ML IJ SOLN
INTRAMUSCULAR | Status: DC | PRN
  Administered 2011-12-22 (×2): 50 ug via INTRAVENOUS

## 2011-12-22 MED ORDER — HYDROCODONE-ACETAMINOPHEN 5-325 MG PO TABS: 1.0000 | ORAL_TABLET | ORAL | Status: DC | PRN

## 2011-12-22 MED ORDER — PROPOFOL 10 MG/ML IV BOLUS
INTRAVENOUS | Status: DC | PRN
  Administered 2011-12-22: 140 mg via INTRAVENOUS

## 2011-12-22 MED ORDER — LACTATED RINGERS IV SOLN: INTRAVENOUS | Status: DC

## 2011-12-22 MED ORDER — LACTATED RINGERS IV SOLN
INTRAVENOUS | Status: DC | PRN
  Administered 2011-12-22: 16:00:00 via INTRAVENOUS

## 2011-12-22 MED ORDER — ONDANSETRON HCL 4 MG/2ML IJ SOLN
INTRAMUSCULAR | Status: DC | PRN
  Administered 2011-12-22: 4 mg via INTRAVENOUS

## 2011-12-22 SURGICAL SUPPLY — 27 items
BANDAGE ELASTIC 6 VELCRO ST LF (GAUZE/BANDAGES/DRESSINGS) ×1 IMPLANT
BLADE CUDA SHAVER 3.5 (BLADE) ×2 IMPLANT
CLOTH BEACON ORANGE TIMEOUT ST (SAFETY) ×2 IMPLANT
CUFF TOURN SGL QUICK 34 (TOURNIQUET CUFF) ×2
CUFF TRNQT CYL 34X4X40X1 (TOURNIQUET CUFF) ×1 IMPLANT
DRAPE U-SHAPE 47X51 STRL (DRAPES) ×2 IMPLANT
DRSG PAD ABDOMINAL 8X10 ST (GAUZE/BANDAGES/DRESSINGS) ×2 IMPLANT
DURAPREP 26ML APPLICATOR (WOUND CARE) ×2 IMPLANT
GAUZE XEROFORM 1X8 LF (GAUZE/BANDAGES/DRESSINGS) ×2 IMPLANT
GLOVE BIOGEL PI IND STRL 8 (GLOVE) ×1 IMPLANT
GLOVE BIOGEL PI INDICATOR 8 (GLOVE) ×1
GLOVE ORTHO TXT STRL SZ7.5 (GLOVE) ×2 IMPLANT
GOWN STRL REIN XL XLG (GOWN DISPOSABLE) ×2 IMPLANT
IV LACTATED RINGER IRRG 3000ML (IV SOLUTION) ×8
IV LR IRRIG 3000ML ARTHROMATIC (IV SOLUTION) ×4 IMPLANT
MANIFOLD NEPTUNE II (INSTRUMENTS) ×2 IMPLANT
PACK ARTHROSCOPY WL (CUSTOM PROCEDURE TRAY) ×2 IMPLANT
PADDING CAST COTTON 6X4 STRL (CAST SUPPLIES) ×2 IMPLANT
POSITIONER SURGICAL ARM (MISCELLANEOUS) ×4 IMPLANT
SPONGE GAUZE 4X4 12PLY (GAUZE/BANDAGES/DRESSINGS) ×1 IMPLANT
SUT ETHILON 4 0 PS 2 18 (SUTURE) ×2 IMPLANT
SYR 20CC LL (SYRINGE) ×2 IMPLANT
SYR CONTROL 10ML LL (SYRINGE) ×2 IMPLANT
TOWEL OR 17X26 10 PK STRL BLUE (TOWEL DISPOSABLE) ×4 IMPLANT
TUBING CONNECTING 10 (TUBING) ×2 IMPLANT
WAND 90 DEG TURBOVAC W/CORD (SURGICAL WAND) IMPLANT
WRAP KNEE MAXI GEL POST OP (GAUZE/BANDAGES/DRESSINGS) ×2 IMPLANT

## 2011-12-22 NOTE — Transfer of Care (Signed)
Immediate Anesthesia Transfer of Care Note  Patient: Alison [name]  Procedure(s) Performed: Procedure(s) (LRB): KNEE ARTHROSCOPY WITH MEDIAL MENISECTOMY (Right)  Patient Location: PACU  Anesthesia Type: General  Level of Consciousness: sedated  Airway & Oxygen Therapy: Patient Spontanous Breathing and Patient connected to face mask oxygen  Post-op Assessment: Report given to PACU RN and Post -op Vital signs reviewed and stable  Post vital signs: Reviewed and stable  Complications: No apparent anesthesia complications

## 2011-12-22 NOTE — Progress Notes (Signed)
Patient up to bathroom with assist. Tolerated well. Verbalized understanding of d/c instructions. Home with ice pack and additional reuseable inserts. Instructions and prescription given to daughter. Patient home with daughter at 37.

## 2011-12-22 NOTE — Anesthesia Preprocedure Evaluation (Addendum)
Anesthesia Evaluation  Patient identified by MRN, date of birth, ID band Patient awake    Reviewed: Allergy & Precautions, H&P , NPO status , Patient's Chart, lab work & pertinent test results  Airway Mallampati: II TM Distance: >3 FB Neck ROM: full    Dental No notable dental hx. (+) Edentulous Upper and Edentulous Lower   Pulmonary neg pulmonary ROS,  breath sounds clear to auscultation  Pulmonary exam normal       Cardiovascular Exercise Tolerance: Good hypertension, Pt. on medications negative cardio ROS  Rhythm:regular Rate:Normal  Pericardial effusion   Neuro/Psych Vertigo positional negative neurological ROS  negative psych ROS   GI/Hepatic negative GI ROS, Neg liver ROS,   Endo/Other  negative endocrine ROSDiabetes mellitus-, Well Controlled, Type 2Borderline dm. Goiter  Renal/GU negative Renal ROS  negative genitourinary   Musculoskeletal   Abdominal   Peds  Hematology negative hematology ROS (+)   Anesthesia Other Findings   Reproductive/Obstetrics negative OB ROS                          Anesthesia Physical Anesthesia Plan  ASA: II  Anesthesia Plan: General LMA   Post-op Pain Management:    Induction:   Airway Management Planned: LMA  Additional Equipment:   Intra-op Plan:   Post-operative Plan:   Informed Consent: I have reviewed the patients History and Physical, chart, labs and discussed the procedure including the risks, benefits and alternatives for the proposed anesthesia with the patient or authorized representative who has indicated his/her understanding and acceptance.   Dental Advisory Given  Plan Discussed with: CRNA and Surgeon  Anesthesia Plan Comments:         Anesthesia Quick Evaluation

## 2011-12-22 NOTE — Brief Op Note (Signed)
12/22/2011  5:30 PM  PATIENT:  Alison [name]  78 y.o. female  PRE-OPERATIVE DIAGNOSIS:  right knee medial meniscal tear  POST-OPERATIVE DIAGNOSIS:  right knee meniscal tear  PROCEDURE:  Procedure(s) (LRB): KNEE ARTHROSCOPY WITH MEDIAL MENISECTOMY (Right)  SURGEON:  Surgeon(s) and Role:    * Kathryne Hitch, MD - Primary  PHYSICIAN ASSISTANT:   ASSISTANTS: none   ANESTHESIA:   general  EBL:  Total I/O In: 500 [I.V.:500] Out: -   BLOOD ADMINISTERED:none  DRAINS: none   LOCAL MEDICATIONS USED:  NONE  SPECIMEN:  No Specimen  DISPOSITION OF SPECIMEN:  N/A  COUNTS:  YES  TOURNIQUET:  * Missing tourniquet times found for documented tourniquets in log:  28266 *  DICTATION: .Other Dictation: Dictation Number [id number]  PLAN OF CARE: Discharge to home after PACU  PATIENT DISPOSITION:  PACU - hemodynamically stable.   Delay start of Pharmacological VTE agent (>24hrs) due to surgical blood loss or risk of bleeding: not applicable

## 2011-12-22 NOTE — Anesthesia Postprocedure Evaluation (Signed)
  Anesthesia Post-op Note  Patient: Alison [name]  Procedure(s) Performed: Procedure(s) (LRB): KNEE ARTHROSCOPY WITH MEDIAL MENISECTOMY (Right)  Patient Location: PACU  Anesthesia Type: General  Level of Consciousness: awake and alert   Airway and Oxygen Therapy: Patient Spontanous Breathing  Post-op Pain: mild  Post-op Assessment: Post-op Vital signs reviewed, Patient's Cardiovascular Status Stable, Respiratory Function Stable, Patent Airway and No signs of Nausea or vomiting  Post-op Vital Signs: stable  Complications: No apparent anesthesia complications

## 2011-12-22 NOTE — H&P (Signed)
Alison [name] is an 78 y.o. female.   Chief Complaint:   Right knee pain with known OA and medial meniscal tear HPI:   78 yo female with a painful right knee.  A MRI shows tricompartmental arthritis and a medial meniscal tear.  She wishes to proceed with a knee arthroscopy with the hopes of decreasing her pain and improving her knee function.  Past Medical History  Diagnosis Date  . Hypertension   . External hemorrhoid   . Multinodular goiter   . Positional vertigo   . Pericardial effusion   . History of fibrocystic disease of breast   . Osteopenia   . Colon polyps     tubulovillous adenoma that requred hemicolectomy (Dr Madilyn Fireman)  . Thyroid nodule     right  . Borderline diabetes 06/30/2011  . Arthritis     right knee     Past Surgical History  Procedure Date  . Hemicolectomy   . Total abdominal hysterectomy w/ bilateral salpingoophorectomy 1984    Family History  Problem Relation Age of Onset  . Hyperlipidemia    . Hypertension    . Esophageal cancer     Social History:  reports that she has never smoked. She has never used smokeless tobacco. She reports that she does not drink alcohol or use illicit drugs.  Allergies:  Allergies  Allergen Reactions  . Tetanus Toxoid Rash    No current facility-administered medications on file as of .   Medications Prior to Admission  Medication Sig Dispense Refill  . amLODipine (NORVASC) 5 MG tablet Take 5 mg by mouth daily after breakfast.      . amLODipine (NORVASC) 5 MG tablet       . meclizine (ANTIVERT) 12.5 MG tablet Take 1 tablet (12.5 mg total) by mouth 3 (three) times daily as needed.  30 tablet  0  . triamcinolone (KENALOG) 0.5 % cream Apply 1 application topically as needed. For hand rash  30 g  0    Results for orders placed during the hospital encounter of 12/21/11 (from the past 48 hour(s))  SURGICAL PCR SCREEN     Status: Normal   Collection Time   12/21/11  8:38 AM      Component Value Range Comment   MRSA,  PCR NEGATIVE  NEGATIVE     Staphylococcus aureus NEGATIVE  NEGATIVE    CBC     Status: Normal   Collection Time   12/21/11  9:00 AM      Component Value Range Comment   WBC 4.1  4.0 - 10.5 (K/uL)    RBC 4.82  3.87 - 5.11 (MIL/uL)    Hemoglobin 14.4  12.0 - 15.0 (g/dL)    HCT 78.2  95.6 - 21.3 (%)    MCV 89.0  78.0 - 100.0 (fL)    MCH 29.9  26.0 - 34.0 (pg)    MCHC 33.6  30.0 - 36.0 (g/dL)    RDW 08.6  57.8 - 46.9 (%)    Platelets 177  150 - 400 (K/uL)   BASIC METABOLIC PANEL     Status: Abnormal   Collection Time   12/21/11  9:00 AM      Component Value Range Comment   Sodium 140  135 - 145 (mEq/L)    Potassium 3.5  3.5 - 5.1 (mEq/L)    Chloride 104  96 - 112 (mEq/L)    CO2 24  19 - 32 (mEq/L)    Glucose, Bld 91  70 -  99 (mg/dL)    BUN 17  6 - 23 (mg/dL)    Creatinine, Ser 9.60  0.50 - 1.10 (mg/dL)    Calcium 9.7  8.4 - 10.5 (mg/dL)    GFR calc non Af Amer 68 (*) >90 (mL/min)    GFR calc Af Amer 79 (*) >90 (mL/min)    Dg Chest 2 View  12/21/2011  *RADIOLOGY REPORT*  Clinical Data: Hypertension  CHEST - 2 VIEW  Comparison: None  Findings: The heart size and mediastinal contours are within normal limits.  Both lungs are clear.  The visualized skeletal structures are unremarkable.  IMPRESSION: Negative exam.  Original Report Authenticated By: Rosealee Albee, M.D.    Review of Systems  All other systems reviewed and are negative.    There were no vitals taken for this visit. Physical Exam  Constitutional: She is oriented to person, place, and time. She appears well-developed and well-nourished.  HENT:  Head: Normocephalic and atraumatic.  Eyes: EOM are normal. Pupils are equal, round, and reactive to light.  Neck: Normal range of motion. Neck supple.  Cardiovascular: Normal rate and regular rhythm.   Respiratory: Effort normal and breath sounds normal.  GI: Soft. Bowel sounds are normal.  Musculoskeletal:       Right knee: She exhibits decreased range of motion and  effusion. tenderness found. Medial joint line tenderness noted.  Neurological: She is alert and oriented to person, place, and time.  Skin: Skin is warm and dry.  Psychiatric: She has a normal mood and affect.     Assessment/Plan To the OR for a right knee arthroscopy today.  Harnoor Reta Y 12/22/2011, 9:50 AM

## 2011-12-22 NOTE — Discharge Instructions (Signed)
Ice and elevation for swelling. Increase your activities as comfort allows. You may remove your dressing tomorrow 3/16, shower, and get your incisions wet. You can place band-aids over your incisions daily. Follow-up at [facility] in 1 week.  [phone])  Call your MD if any problems or questions. Call if fever >101, problems urinating; incision gets red, swollen, or has drainage; if your pain gets worse instead of better; or any other problems.

## 2011-12-23 NOTE — Op Note (Signed)
NAMEJANAT, TABBERT NO.:  [id number]  MEDICAL RECORD NO.:  [id number]  LOCATION:  [facility]                         FACILITY:  [facility]  PHYSICIAN:  Vanita Panda. Magnus Ivan, M.D.DATE OF BIRTH:  1933-11-01  DATE OF PROCEDURE:  12/22/2011 DATE OF DISCHARGE:  12/22/2011                              OPERATIVE REPORT   PREOPERATIVE DIAGNOSIS:  Right knee tricompartmental arthritis with medial meniscal tear.  POSTOPERATIVE DIAGNOSIS:  Right knee tricompartmental arthritis with medial meniscal tear.  PROCEDURE:  Right knee arthroscopy with debridement and partial medial meniscectomy.  SURGEON:  Vanita Panda. Magnus Ivan, M.D.  ANESTHESIA:  General.  BLOOD LOSS:  Minimal.  COMPLICATIONS:  None.  INDICATIONS:  Alison [name] is a 78 year old female with right knee pain, it started for some time now and MRI showed tricompartmental arthritic changes with a medial meniscal tear.  I recommended likely a total knee replacement would be better for her; however, she wished to proceed only with intervention of arthroscopic nature.  I discussed the risks and benefits of this in detail and she is well informed as well as her family, and they did wish to proceed with just an arthroscopic intervention.  PROCEDURE IN DETAIL:  After informed consent was obtained, appropriate right knee was marked.  She was brought to the operative room, placed supine on the operating table.  General anesthesia was then obtained.  A lateral leg post was utilized and the right leg was prepped and draped from the thigh down to the ankle with DuraPrep and sterile drapes including sterile stockinette.  A time-out was called to identify the correct patient and correct right knee.  I then made an anterolateral arthroscopy portal and inserted the camera into the knee.  I went directly to the medial side and made an anterior medial portal.  There was an effusion encountered the knee and the medial compartment.   I found that there was significant cartilage changes on the medial femoral condyle and there was degenerative posterior horn to mid body meniscal tear.  Using up cutting biters and arthroscopic shaver, I was able to perform a partial medial meniscectomy and debrided this back to a stable margin.  I then cleaned cartilage on the medial femoral condyle as well. There was no lateral meniscal tear at all and actually pretty good cartilage on the lateral side.  The patellofemoral joint showed severe grade 4 chondromalacia with synovitis as well and I was able to clean this as well.  I then allowed fluid to lavage with the knee.  I removed all instrumentation.  I drained fluid from the knee.  I closed the portal sites with interrupted nylon suture and I inserted a mixture of morphine and 0.5% Marcaine into her knee.  A well-padded sterile dressing was applied, and she was awakened, extubated, and taken to recovery room in stable condition.  All final counts correct. There were no complications noted.  Postoperatively, she will be discharged home from the PACU with increasing activities as tolerated and  follow up in a week.     Vanita Panda. Magnus Ivan, M.D.     CYB/MEDQ  D:  12/22/2011  T:  12/23/2011  Job:  [id number]

## 2012-01-01 ENCOUNTER — Telehealth: Payer: Self-pay | Admitting: Family

## 2012-01-01 MED ORDER — AMLODIPINE BESYLATE 5 MG PO TABS: 5.0000 mg | ORAL_TABLET | Freq: Every day | ORAL | Status: DC

## 2012-01-01 NOTE — Telephone Encounter (Signed)
Patient made an appointment for tomorrow since her husband has an appointment also

## 2012-01-01 NOTE — Telephone Encounter (Signed)
Pt was due for 6 week follow up in January and did not keep appt. She has no future appts on file with [place]. A 30 day supply of amlodipine has been sent to her pharmacy but she will need to be seen before that runs out to continue further refills. Please call pt to arrange appt.

## 2012-01-01 NOTE — Telephone Encounter (Signed)
Request for authorization to dispense 90 days supply.  Amlodipine besylate 5mg  tab.

## 2012-01-02 ENCOUNTER — Ambulatory Visit (INDEPENDENT_AMBULATORY_CARE_PROVIDER_SITE_OTHER): Payer: Medicare Other | Admitting: Family

## 2012-01-02 ENCOUNTER — Encounter: Payer: Self-pay | Admitting: Family

## 2012-01-02 VITALS — BP 118/60 | HR 55 | Temp 98.2°F | Resp 16 | Ht 64.0 in | Wt 161.0 lb

## 2012-01-02 DIAGNOSIS — E042 Nontoxic multinodular goiter: Secondary | ICD-10-CM

## 2012-01-02 DIAGNOSIS — H8110 Benign paroxysmal vertigo, unspecified ear: Secondary | ICD-10-CM

## 2012-01-02 DIAGNOSIS — I10 Essential (primary) hypertension: Secondary | ICD-10-CM

## 2012-01-02 DIAGNOSIS — R7309 Other abnormal glucose: Secondary | ICD-10-CM

## 2012-01-02 DIAGNOSIS — IMO0002 Reserved for concepts with insufficient information to code with codable children: Secondary | ICD-10-CM

## 2012-01-02 DIAGNOSIS — R7303 Prediabetes: Secondary | ICD-10-CM

## 2012-01-02 DIAGNOSIS — S83206A Unspecified tear of unspecified meniscus, current injury, right knee, initial encounter: Secondary | ICD-10-CM

## 2012-01-02 LAB — HEMOGLOBIN A1C
Hgb A1c MFr Bld: 6 % — ABNORMAL HIGH (ref <5.7)
Mean Plasma Glucose: 126 mg/dL — ABNORMAL HIGH (ref <117)

## 2012-01-02 MED ORDER — AMLODIPINE BESYLATE 5 MG PO TABS: 5.0000 mg | ORAL_TABLET | Freq: Every day | ORAL | Status: DC

## 2012-01-02 MED ORDER — BISOPROLOL FUMARATE 5 MG PO TABS: 2.5000 mg | ORAL_TABLET | Freq: Every day | ORAL | Status: DC

## 2012-01-02 NOTE — Assessment & Plan Note (Signed)
S/p Right Knee Arthroscopy with Partial Medial Meniscectomy- monitor.

## 2012-01-02 NOTE — Assessment & Plan Note (Signed)
A1C 6.0 last visit. Monitor.

## 2012-01-02 NOTE — Patient Instructions (Signed)
Please complete your blood work prior to leaving.  Follow up in 3 months. 

## 2012-01-02 NOTE — Assessment & Plan Note (Signed)
BP Readings from Last 3 Encounters:  01/02/12 118/60  12/22/11 125/51  12/22/11 125/51  BP remains stable on current meds. Continue same.

## 2012-01-02 NOTE — Assessment & Plan Note (Signed)
Currently asymptomatic  Monitor

## 2012-01-02 NOTE — Assessment & Plan Note (Signed)
She is following with Dr. Everardo All, will defer management to him.

## 2012-01-02 NOTE — Progress Notes (Signed)
  Subjective:    Patient ID: Alison [name], female    DOB: 1934-09-24, 78 y.o.   MRN: [id number]  HPI  Ms.  Krall is a 78 yr old female who presents today for follow up.    Vertigo- she reports that she is no longer having vertigo symptoms.  HTN-  Currently on bisoprolol and norvasc.   OA- pt is s/p Right Knee Arthroscopy with Partial Medial Meniscectomy 3/15 with Dr. Magnus Ivan.  Reports knee is feeling mildly sore, but otherwise she has been feeling good.    Review of Systems See HPI  Past Medical History  Diagnosis Date  . Hypertension   . External hemorrhoid   . Multinodular goiter   . Positional vertigo   . Pericardial effusion   . History of fibrocystic disease of breast   . Osteopenia   . Colon polyps     tubulovillous adenoma that requred hemicolectomy (Dr Madilyn Fireman)  . Thyroid nodule     right  . Borderline diabetes 06/30/2011  . Arthritis     right knee     History   Social History  . Marital Status: Married    Spouse Name: N/A    Number of Children: N/A  . Years of Education: N/A   Occupational History  . Retired     [employer]   Social History Main Topics  . Smoking status: Never Smoker   . Smokeless tobacco: Never Used  . Alcohol Use: No  . Drug Use: No  . Sexually Active: Not on file   Other Topics Concern  . Not on file   Social History Narrative  . No narrative on file    Past Surgical History  Procedure Date  . Hemicolectomy   . Total abdominal hysterectomy w/ bilateral salpingoophorectomy 1984  . Knee arthroscopy 12/20/11    right knee--Dr Blackmon    Family History  Problem Relation Age of Onset  . Hyperlipidemia    . Hypertension    . Esophageal cancer      Allergies  Allergen Reactions  . Tetanus Toxoid Rash    Current Outpatient Prescriptions on File Prior to Visit  Medication Sig Dispense Refill  . triamcinolone (KENALOG) 0.5 % cream Apply 1 application topically as needed. For hand rash  30 g  0    BP 118/60   Pulse 55  Temp(Src) 98.2 F (36.8 C) (Oral)  Resp 16  Ht 5\' 4"  (1.626 m)  Wt 161 lb (73.029 kg)  BMI 27.64 kg/m2  SpO2 99%       Objective:   Physical Exam  Constitutional: She appears well-developed and well-nourished. No distress.  Cardiovascular: Normal rate and regular rhythm.   No murmur heard. Pulmonary/Chest: Effort normal and breath sounds normal. No respiratory distress. She has no wheezes. She has no rales. She exhibits no tenderness.  Musculoskeletal: She exhibits no edema.  Psychiatric: She has a normal mood and affect. Her behavior is normal. Judgment and thought content normal.          Assessment & Plan:

## 2012-01-03 ENCOUNTER — Encounter: Payer: Self-pay | Admitting: Family

## 2012-02-07 ENCOUNTER — Other Ambulatory Visit: Payer: Self-pay | Admitting: Family

## 2012-02-07 NOTE — Telephone Encounter (Signed)
Amlodipine request denied as refills were just sent on 01/02/12. Sent note to pharmacy to check refills on file.

## 2012-03-07 ENCOUNTER — Other Ambulatory Visit: Payer: Self-pay | Admitting: Family

## 2012-03-07 NOTE — Telephone Encounter (Signed)
Patient made an appointment for 03/15/12

## 2012-03-07 NOTE — Telephone Encounter (Signed)
Current request denied, left message on pharmacy voicemail that authorization was sent in on 01/02/12 #30 x 3 refills, check refill on file and call if any questions. Pt has not scheduled f/u yet and is due for f/u in June.  Please call pt to arrange appt.

## 2012-03-15 ENCOUNTER — Ambulatory Visit (INDEPENDENT_AMBULATORY_CARE_PROVIDER_SITE_OTHER): Payer: Medicare Other | Admitting: Family

## 2012-03-15 ENCOUNTER — Encounter: Payer: Self-pay | Admitting: Family

## 2012-03-15 VITALS — BP 140/78 | HR 59 | Temp 97.9°F | Resp 16 | Ht 64.0 in | Wt 161.1 lb

## 2012-03-15 DIAGNOSIS — R7303 Prediabetes: Secondary | ICD-10-CM

## 2012-03-15 DIAGNOSIS — M6530 Trigger finger, unspecified finger: Secondary | ICD-10-CM

## 2012-03-15 DIAGNOSIS — R7309 Other abnormal glucose: Secondary | ICD-10-CM

## 2012-03-15 DIAGNOSIS — M65312 Trigger thumb, left thumb: Secondary | ICD-10-CM

## 2012-03-15 DIAGNOSIS — I10 Essential (primary) hypertension: Secondary | ICD-10-CM

## 2012-03-15 MED ORDER — AMLODIPINE BESYLATE 5 MG PO TABS: 5.0000 mg | ORAL_TABLET | Freq: Every day | ORAL | Status: DC

## 2012-03-15 MED ORDER — BISOPROLOL FUMARATE 5 MG PO TABS: 2.5000 mg | ORAL_TABLET | Freq: Every day | ORAL | Status: DC

## 2012-03-15 NOTE — Assessment & Plan Note (Signed)
Last A1C in the end of march was 6.0. She continues a diabetic diet.

## 2012-03-15 NOTE — Assessment & Plan Note (Signed)
BP Readings from Last 3 Encounters:  03/15/12 140/78  01/02/12 118/60  12/22/11 125/51   Pt's bp is up a bit today as she has been out of amlodipine.  Resume amlodipine.

## 2012-03-15 NOTE — Assessment & Plan Note (Signed)
Mild and asymptomatic. Consider referral to hand specialist if symptoms worsen.

## 2012-03-15 NOTE — Progress Notes (Signed)
Subjective:    Patient ID: Alison [name], female    DOB: 11-20-1933, 78 y.o.   MRN: [id number]  HPI  Ms.  Alison [name] is a  78 yr old female who presents today for follow up.  1) HTN-  Currently on amlodipine and zebeta. She ran out of amlodipine 2 days ago.    2) Borderline DM-Pt reports that she continues to watch her diet.    3) Thumb-notes that her left thumb joint snaps when she bends her left thumb.  She denies pain, swelling or difficulty using her left thumb.   Review of Systems    see HPI  Past Medical History  Diagnosis Date  . Hypertension   . External hemorrhoid   . Multinodular goiter   . Positional vertigo   . Pericardial effusion   . History of fibrocystic disease of breast   . Osteopenia   . Colon polyps     tubulovillous adenoma that requred hemicolectomy (Dr Madilyn Fireman)  . Thyroid nodule     right  . Borderline diabetes 06/30/2011  . Arthritis     right knee     History   Social History  . Marital Status: Married    Spouse Name: N/A    Number of Children: N/A  . Years of Education: N/A   Occupational History  . Retired     [employer]   Social History Main Topics  . Smoking status: Never Smoker   . Smokeless tobacco: Never Used  . Alcohol Use: No  . Drug Use: No  . Sexually Active: Not on file   Other Topics Concern  . Not on file   Social History Narrative  . No narrative on file    Past Surgical History  Procedure Date  . Hemicolectomy   . Total abdominal hysterectomy w/ bilateral salpingoophorectomy 1984  . Knee arthroscopy 12/20/11    right knee--Dr Blackmon    Family History  Problem Relation Age of Onset  . Hyperlipidemia    . Hypertension    . Esophageal cancer      Allergies  Allergen Reactions  . Tetanus Toxoid Rash    Current Outpatient Prescriptions on File Prior to Visit  Medication Sig Dispense Refill  . triamcinolone (KENALOG) 0.5 % cream Apply 1 application topically as needed. For hand rash  30 g  0  .  DISCONTD: amLODipine (NORVASC) 5 MG tablet Take 1 tablet (5 mg total) by mouth daily after breakfast.  30 tablet  3  . DISCONTD: bisoprolol (ZEBETA) 5 MG tablet Take 0.5 tablets (2.5 mg total) by mouth daily after breakfast.  30 tablet  3  . DISCONTD: amLODipine (NORVASC) 5 MG tablet       . DISCONTD: Calcium Carbonate-Vitamin D (CALTRATE 600+D) 600-400 MG-UNIT per tablet Take 1 tablet by mouth 2 (two) times daily.        BP 140/78  Pulse 59  Temp(Src) 97.9 F (36.6 C) (Oral)  Resp 16  Ht 5\' 4"  (1.626 m)  Wt 161 lb 1.9 oz (73.084 kg)  BMI 27.66 kg/m2  SpO2 99%    Objective:   Physical Exam  Constitutional: She appears well-developed and well-nourished. No distress.  Cardiovascular: Normal rate and regular rhythm.   No murmur heard. Pulmonary/Chest: Effort normal and breath sounds normal. No respiratory distress. She has no wheezes. She has no rales. She exhibits no tenderness.  Musculoskeletal: She exhibits no edema.       Left thumb interphalangeal joint clicking with bending.  Psychiatric: She has a normal mood and affect. Her behavior is normal. Judgment and thought content normal.          Assessment & Plan:

## 2012-05-27 ENCOUNTER — Encounter: Payer: Self-pay | Admitting: Internal Medicine

## 2012-06-14 ENCOUNTER — Telehealth: Payer: Self-pay | Admitting: Family

## 2012-06-14 MED ORDER — AMLODIPINE BESYLATE 5 MG PO TABS: 5.0000 mg | ORAL_TABLET | Freq: Every day | ORAL | Status: DC

## 2012-06-14 NOTE — Telephone Encounter (Signed)
Refill- amlodipine besylate 5mg  tab. Take one tablet by mouth every day. Qty 30 last fill 8.3.13

## 2012-06-14 NOTE — Telephone Encounter (Signed)
Amlodipine refill sent to pharmacy.  Pt is due for follow up this month. Please call pt to arrange appt.

## 2012-06-15 NOTE — Telephone Encounter (Signed)
Spoke to patient and informed her that refill has been sent to pharmacy and she states that she will call back to schedule an appointment.

## 2012-07-19 ENCOUNTER — Encounter: Payer: Self-pay | Admitting: Family

## 2012-07-19 ENCOUNTER — Ambulatory Visit (INDEPENDENT_AMBULATORY_CARE_PROVIDER_SITE_OTHER): Payer: Medicare Other | Admitting: Family

## 2012-07-19 VITALS — BP 122/64 | HR 61 | Temp 98.9°F | Resp 12 | Ht 64.0 in | Wt 163.0 lb

## 2012-07-19 DIAGNOSIS — R7303 Prediabetes: Secondary | ICD-10-CM

## 2012-07-19 DIAGNOSIS — S83206A Unspecified tear of unspecified meniscus, current injury, right knee, initial encounter: Secondary | ICD-10-CM

## 2012-07-19 DIAGNOSIS — IMO0002 Reserved for concepts with insufficient information to code with codable children: Secondary | ICD-10-CM

## 2012-07-19 DIAGNOSIS — R7309 Other abnormal glucose: Secondary | ICD-10-CM

## 2012-07-19 DIAGNOSIS — E042 Nontoxic multinodular goiter: Secondary | ICD-10-CM

## 2012-07-19 DIAGNOSIS — I10 Essential (primary) hypertension: Secondary | ICD-10-CM

## 2012-07-19 LAB — HEMOGLOBIN A1C: Mean Plasma Glucose: 126 mg/dL — ABNORMAL HIGH (ref <117)

## 2012-07-19 LAB — BASIC METABOLIC PANEL
BUN: 13 mg/dL (ref 6–23)
Calcium: 9.7 mg/dL (ref 8.4–10.5)
Creat: 0.83 mg/dL (ref 0.50–1.10)

## 2012-07-19 MED ORDER — BISOPROLOL FUMARATE 5 MG PO TABS: 2.5000 mg | ORAL_TABLET | Freq: Every day | ORAL | Status: DC

## 2012-07-19 MED ORDER — AMLODIPINE BESYLATE 5 MG PO TABS: 5.0000 mg | ORAL_TABLET | Freq: Every day | ORAL | Status: DC

## 2012-07-19 NOTE — Patient Instructions (Addendum)
Please complete blood work prior to leaving. Please schedule a follow up appointment in 3 months.  

## 2012-07-19 NOTE — Progress Notes (Signed)
Subjective:    Patient ID: Alison [name], female    DOB: [birth year], 78 y.o.   MRN: [id number]  HPI  Ms.  [name] is a 78 yr old female who presents today for follow up.  Borderline DM- Diet controlled.  Last A1C back in March was 6.0.  Multinodular goiter- follows with Dr. Everardo All, she follows with him annually.  HTN-Currently mainataind on zebeta and amlodipine.  She denies CP/SOB or swelling.   Arthritis- had knee surgery Dr. Magnus Ivan, in March.  Reports + knee stiffness, but denies pain.  Review of Systems See HPI  Past Medical History  Diagnosis Date  . Hypertension   . External hemorrhoid   . Multinodular goiter   . Positional vertigo   . Pericardial effusion   . History of fibrocystic disease of breast   . Osteopenia   . Colon polyps     tubulovillous adenoma that requred hemicolectomy (Dr Madilyn Fireman)  . Thyroid nodule     right  . Borderline diabetes 06/30/2011  . Arthritis     right knee     History   Social History  . Marital Status: Married    Spouse Name: N/A    Number of Children: N/A  . Years of Education: N/A   Occupational History  . Retired     [employer]   Social History Main Topics  . Smoking status: Never Smoker   . Smokeless tobacco: Never Used  . Alcohol Use: No  . Drug Use: No  . Sexually Active: Not on file   Other Topics Concern  . Not on file   Social History Narrative  . No narrative on file    Past Surgical History  Procedure Date  . Hemicolectomy   . Total abdominal hysterectomy w/ bilateral salpingoophorectomy 1984  . Knee arthroscopy 12/20/11    right knee--Dr Blackmon    Family History  Problem Relation Age of Onset  . Hyperlipidemia    . Hypertension    . Esophageal cancer      Allergies  Allergen Reactions  . Tetanus Toxoid Rash    Current Outpatient Prescriptions on File Prior to Visit  Medication Sig Dispense Refill  . amLODipine (NORVASC) 5 MG tablet Take 1 tablet (5 mg total) by mouth daily after  breakfast.  30 tablet  5  . bisoprolol (ZEBETA) 5 MG tablet Take 0.5 tablets (2.5 mg total) by mouth daily after breakfast.  30 tablet  5  . triamcinolone (KENALOG) 0.5 % cream Apply 1 application topically as needed. For hand rash  30 g  0  . DISCONTD: Calcium Carbonate-Vitamin D (CALTRATE 600+D) 600-400 MG-UNIT per tablet Take 1 tablet by mouth 2 (two) times daily.        BP 122/64  Pulse 61  Temp 98.9 F (37.2 C) (Oral)  Resp 12  Ht 5\' 4"  (1.626 m)  Wt 163 lb (73.936 kg)  BMI 27.98 kg/m2  SpO2 98%       Objective:   Physical Exam  Constitutional: She appears well-nourished. No distress.  Cardiovascular: Normal rate and regular rhythm.   No murmur heard. Pulmonary/Chest: Effort normal and breath sounds normal. No respiratory distress. She has no wheezes. She has no rales. She exhibits no tenderness.  Musculoskeletal: She exhibits no edema.  Skin: Skin is warm and dry.  Psychiatric: She has a normal mood and affect. Her behavior is normal. Judgment and thought content normal.          Assessment & Plan:

## 2012-07-21 ENCOUNTER — Encounter: Payer: Self-pay | Admitting: Family

## 2012-07-22 NOTE — Assessment & Plan Note (Signed)
Stable on Zebeta and bisoprolol.  Continue same.

## 2012-07-22 NOTE — Assessment & Plan Note (Signed)
Management per ortho. 

## 2012-07-22 NOTE — Assessment & Plan Note (Signed)
Clinically stable, obtain A1C.  

## 2012-07-22 NOTE — Assessment & Plan Note (Signed)
Defer management to Dr. Everardo All (Endo)

## 2012-09-04 ENCOUNTER — Other Ambulatory Visit: Payer: Self-pay | Admitting: Family

## 2012-10-14 ENCOUNTER — Other Ambulatory Visit: Payer: Self-pay | Admitting: Family

## 2012-10-14 NOTE — Telephone Encounter (Signed)
Last Rx for Amlodipine 10.11.13 #30x5; phoned pharmacy to check status, verified request was an error; Denial sent/SLS

## 2012-10-29 ENCOUNTER — Encounter: Payer: Self-pay | Admitting: Family

## 2012-10-29 ENCOUNTER — Ambulatory Visit (INDEPENDENT_AMBULATORY_CARE_PROVIDER_SITE_OTHER): Payer: Medicare Other | Admitting: Family

## 2012-10-29 VITALS — BP 118/76 | HR 66 | Temp 98.9°F | Resp 16 | Ht 64.0 in | Wt 165.1 lb

## 2012-10-29 DIAGNOSIS — R350 Frequency of micturition: Secondary | ICD-10-CM

## 2012-10-29 LAB — POCT URINALYSIS DIPSTICK
Blood, UA: NEGATIVE
Nitrite, UA: NEGATIVE
Protein, UA: NEGATIVE
Urobilinogen, UA: 0.2
pH, UA: 6

## 2012-10-29 MED ORDER — CIPROFLOXACIN HCL 250 MG PO TABS: 250.0000 mg | ORAL_TABLET | Freq: Two times a day (BID) | ORAL | Status: DC

## 2012-10-29 NOTE — Patient Instructions (Addendum)
Please call if your symptoms worsen or if no improvement in 1 week.

## 2012-10-29 NOTE — Assessment & Plan Note (Signed)
Urine dip reviewed- unremarkable.  However pt is symptomatic.  Will rx empirically with cipro x 3 days and sent urine for culture.

## 2012-10-29 NOTE — Progress Notes (Signed)
  Subjective:    Patient ID: Alison [name], female    DOB: 1934-08-19, 79 y.o.   MRN: [id number]  HPI  Alison [name] is a 79 yr old female who presents today with chief complaint of urinary frequency.  She reports that her symptoms started >1 week ago and initially worsened then started to improve.  Denies dysuria, hematuria, blood or unusual low back pain.     Review of Systems See HPI  Past Medical History  Diagnosis Date  . Hypertension   . External hemorrhoid   . Multinodular goiter   . Positional vertigo   . Pericardial effusion   . History of fibrocystic disease of breast   . Osteopenia   . Colon polyps     tubulovillous adenoma that requred hemicolectomy (Dr Madilyn Fireman)  . Thyroid nodule     right  . Borderline diabetes 06/30/2011  . Arthritis     right knee     History   Social History  . Marital Status: Married    Spouse Name: N/A    Number of Children: N/A  . Years of Education: N/A   Occupational History  . Retired     [employer]   Social History Main Topics  . Smoking status: Never Smoker   . Smokeless tobacco: Never Used  . Alcohol Use: No  . Drug Use: No  . Sexually Active: Not on file   Other Topics Concern  . Not on file   Social History Narrative  . No narrative on file    Past Surgical History  Procedure Date  . Hemicolectomy   . Total abdominal hysterectomy w/ bilateral salpingoophorectomy 1984  . Knee arthroscopy 12/20/11    right knee--Dr Blackmon    Family History  Problem Relation Age of Onset  . Hyperlipidemia    . Hypertension    . Esophageal cancer      Allergies  Allergen Reactions  . Tetanus Toxoid Rash    Current Outpatient Prescriptions on File Prior to Visit  Medication Sig Dispense Refill  . amLODipine (NORVASC) 5 MG tablet Take 1 tablet (5 mg total) by mouth daily after breakfast.  30 tablet  5  . bisoprolol (ZEBETA) 5 MG tablet Take 0.5 tablets (2.5 mg total) by mouth daily after breakfast.  30 tablet  5  .  triamcinolone (KENALOG) 0.5 % cream Apply 1 application topically as needed. For hand rash  30 g  0  . [DISCONTINUED] Calcium Carbonate-Vitamin D (CALTRATE 600+D) 600-400 MG-UNIT per tablet Take 1 tablet by mouth 2 (two) times daily.        BP 118/76  Pulse 66  Temp 98.9 F (37.2 C) (Oral)  Resp 16  Ht 5\' 4"  (1.626 m)  Wt 165 lb 1.9 oz (74.898 kg)  BMI 28.34 kg/m2  SpO2 99%       Objective:   Physical Exam  Constitutional: She is oriented to person, place, and time. She appears well-developed and well-nourished. No distress.  Cardiovascular: Normal rate and regular rhythm.   No murmur heard. Pulmonary/Chest: Effort normal and breath sounds normal. No respiratory distress. She has no wheezes. She has no rales. She exhibits no tenderness.  Musculoskeletal: She exhibits no edema.  Neurological: She is alert and oriented to person, place, and time.  Psychiatric: She has a normal mood and affect. Her behavior is normal. Judgment and thought content normal.          Assessment & Plan:

## 2012-11-11 ENCOUNTER — Telehealth: Payer: Self-pay | Admitting: Family

## 2012-11-11 MED ORDER — BISOPROLOL FUMARATE 5 MG PO TABS: 2.5000 mg | ORAL_TABLET | Freq: Every day | ORAL | Status: DC

## 2012-11-11 NOTE — Telephone Encounter (Signed)
Refill- bisoprolol fumarate 5mg  tab. Take 1/2 tablet(2.5mg  total) by mouth daily after breakfast. Qty 30 last fill 11.27.13

## 2013-02-19 ENCOUNTER — Encounter: Payer: Self-pay | Admitting: Cardiology

## 2013-02-19 ENCOUNTER — Ambulatory Visit (INDEPENDENT_AMBULATORY_CARE_PROVIDER_SITE_OTHER): Payer: Medicare Other | Admitting: Cardiology

## 2013-02-19 VITALS — BP 132/70 | HR 77 | Wt 165.0 lb

## 2013-02-19 DIAGNOSIS — I319 Disease of pericardium, unspecified: Secondary | ICD-10-CM

## 2013-02-19 DIAGNOSIS — R0600 Dyspnea, unspecified: Secondary | ICD-10-CM

## 2013-02-19 DIAGNOSIS — I10 Essential (primary) hypertension: Secondary | ICD-10-CM

## 2013-02-19 NOTE — Patient Instructions (Addendum)
Your physician recommends that you schedule a follow-up appointment in: AS NEEDED PENDING TEST RESULTS  Your physician has requested that you have an echocardiogram. Echocardiography is a painless test that uses sound waves to create images of your heart. It provides your doctor with information about the size and shape of your heart and how well your heart's chambers and valves are working. This procedure takes approximately one hour. There are no restrictions for this procedure.    

## 2013-02-19 NOTE — Assessment & Plan Note (Signed)
Repeat echocardiogram. Small on previous study.

## 2013-02-19 NOTE — Assessment & Plan Note (Signed)
Etiology unclear. No risk factors for pulmonary embolus. Not volume overloaded on examination. Plan repeat echocardiogram to assess LV function and to reassess pericardial effusion. If negative she may need to see primary care for consideration of further pulmonary evaluation. Note her electrocardiogram is normal and she is not having chest pain.

## 2013-02-19 NOTE — Assessment & Plan Note (Signed)
Blood pressure controlled. Continue present medications. 

## 2013-02-19 NOTE — Progress Notes (Signed)
   HPI: Pleasant female for fu of palpitations. Echocardiogram in 2008 showed normal LV function, mild mitral regurgitation and a small pericardial effusion. Holter monitor in January of 2012 showed sinus rhythm with occasional PAC, PVC and brief PAT-5 beats. No symptoms reported. TSH in January of 2012 was normal. I last saw her in February of 2012. Since then, she has noticed over the past several months some dyspnea when she exerts herself. This occurs when she starts but then resolves with continued exertion. There is no orthopnea, PND, pedal edema, chest pain, fevers, chills, productive cough or hemoptysis. No recent travel or leg injury.  Current Outpatient Prescriptions  Medication Sig Dispense Refill  . amLODipine (NORVASC) 5 MG tablet Take 1 tablet (5 mg total) by mouth daily after breakfast.  30 tablet  5  . triamcinolone (KENALOG) 0.5 % cream Apply 1 application topically as needed. For hand rash  30 g  0  . [DISCONTINUED] Calcium Carbonate-Vitamin D (CALTRATE 600+D) 600-400 MG-UNIT per tablet Take 1 tablet by mouth 2 (two) times daily.       No current facility-administered medications for this visit.     Past Medical History  Diagnosis Date  . Hypertension   . External hemorrhoid   . Multinodular goiter   . Positional vertigo   . Pericardial effusion   . History of fibrocystic disease of breast   . Osteopenia   . Colon polyps     tubulovillous adenoma that requred hemicolectomy (Dr Madilyn Fireman)  . Thyroid nodule     right  . Borderline diabetes 06/30/2011  . Arthritis     right knee     Past Surgical History  Procedure Laterality Date  . Hemicolectomy    . Total abdominal hysterectomy w/ bilateral salpingoophorectomy  1984  . Knee arthroscopy  12/20/11    right knee--Dr Blackmon    History   Social History  . Marital Status: Married    Spouse Name: N/A    Number of Children: N/A  . Years of Education: N/A   Occupational History  . Retired     [employer]    Social History Main Topics  . Smoking status: Never Smoker   . Smokeless tobacco: Never Used  . Alcohol Use: No  . Drug Use: No  . Sexually Active: Not on file   Other Topics Concern  . Not on file   Social History Narrative  . No narrative on file    ROS: no fevers or chills, productive cough, hemoptysis, dysphasia, odynophagia, melena, hematochezia, dysuria, hematuria, rash, seizure activity, orthopnea, PND, pedal edema, claudication. Remaining systems are negative.  Physical Exam: Well-developed well-nourished in no acute distress.  Skin is warm and dry.  HEENT is normal.  Neck is supple.  Chest is clear to auscultation with normal expansion.  Cardiovascular exam is regular rate and rhythm.  Abdominal exam nontender or distended. No masses palpated. Extremities show no edema. neuro grossly intact  ECG sinus rhythm at a rate of 77. No ST changes.

## 2013-03-11 ENCOUNTER — Ambulatory Visit (HOSPITAL_COMMUNITY): Payer: Medicare Other | Attending: Cardiology | Admitting: Radiology

## 2013-03-11 DIAGNOSIS — R0602 Shortness of breath: Secondary | ICD-10-CM

## 2013-03-11 DIAGNOSIS — R0600 Dyspnea, unspecified: Secondary | ICD-10-CM

## 2013-03-11 DIAGNOSIS — R0609 Other forms of dyspnea: Secondary | ICD-10-CM | POA: Insufficient documentation

## 2013-03-11 DIAGNOSIS — R0989 Other specified symptoms and signs involving the circulatory and respiratory systems: Secondary | ICD-10-CM | POA: Insufficient documentation

## 2013-03-11 NOTE — Progress Notes (Signed)
Echocardiogram performed.  

## 2013-03-14 ENCOUNTER — Other Ambulatory Visit: Payer: Self-pay | Admitting: Family

## 2013-06-17 ENCOUNTER — Encounter: Payer: Self-pay | Admitting: Internal Medicine

## 2013-08-04 ENCOUNTER — Ambulatory Visit (INDEPENDENT_AMBULATORY_CARE_PROVIDER_SITE_OTHER): Payer: Medicare Other | Admitting: Family

## 2013-08-04 ENCOUNTER — Encounter: Payer: Self-pay | Admitting: Family

## 2013-08-04 ENCOUNTER — Other Ambulatory Visit: Payer: Self-pay | Admitting: Family

## 2013-08-04 VITALS — BP 136/80 | HR 75 | Temp 98.1°F | Resp 16 | Ht 64.0 in | Wt 161.1 lb

## 2013-08-04 DIAGNOSIS — E042 Nontoxic multinodular goiter: Secondary | ICD-10-CM

## 2013-08-04 DIAGNOSIS — R7309 Other abnormal glucose: Secondary | ICD-10-CM

## 2013-08-04 DIAGNOSIS — K5900 Constipation, unspecified: Secondary | ICD-10-CM

## 2013-08-04 DIAGNOSIS — I10 Essential (primary) hypertension: Secondary | ICD-10-CM

## 2013-08-04 DIAGNOSIS — R7303 Prediabetes: Secondary | ICD-10-CM

## 2013-08-04 DIAGNOSIS — E785 Hyperlipidemia, unspecified: Secondary | ICD-10-CM

## 2013-08-04 DIAGNOSIS — K5909 Other constipation: Secondary | ICD-10-CM

## 2013-08-04 LAB — BASIC METABOLIC PANEL
CO2: 28 mEq/L (ref 19–32)
Calcium: 9.7 mg/dL (ref 8.4–10.5)
Creat: 0.79 mg/dL (ref 0.50–1.10)
Potassium: 4.1 mEq/L (ref 3.5–5.3)
Sodium: 142 mEq/L (ref 135–145)

## 2013-08-04 LAB — LIPID PANEL
Cholesterol: 195 mg/dL (ref 0–200)
HDL: 48 mg/dL (ref >39)
Total CHOL/HDL Ratio: 4.1 Ratio
Triglycerides: 201 mg/dL — ABNORMAL HIGH (ref <150)

## 2013-08-04 LAB — HEMOGLOBIN A1C
Hgb A1c MFr Bld: 6 % — ABNORMAL HIGH (ref <5.7)
Mean Plasma Glucose: 126 mg/dL — ABNORMAL HIGH (ref <117)

## 2013-08-04 MED ORDER — AMLODIPINE BESYLATE 5 MG PO TABS: ORAL_TABLET | ORAL | Status: DC

## 2013-08-04 MED ORDER — TRIAMCINOLONE ACETONIDE 0.5 % EX CREA: 1.0000 "application " | TOPICAL_CREAM | CUTANEOUS | Status: DC | PRN

## 2013-08-04 NOTE — Patient Instructions (Signed)
Please complete lab work prior to leaving. Follow up in 3 months.  

## 2013-08-04 NOTE — Progress Notes (Signed)
Subjective:    Patient ID: Alison [name], female    DOB: 14-Feb-1934, 79 y.o.   MRN: [id number]  HPI  Alison [name] is a 79 yr old female who presents today for follow up of multiple medical problems:  1) HTN-  She is currently maintained on amlodipine. She reports that she is compliant with her blood pressure medication.  Denies chest pain or shortness.   2) Borderline DM- Her last A1C on file was 10/13 and was 6.0.  3) Constipation- she is requesting rx for generic miralax. Reports that she uses it every other day with good success.   4) multinodular goiter- she is followed by Dr. Everardo All but has not been seen for follow up since 1/13.  Reports that she underwent PT for her hip which helped.   Review of Systems See HPI Past Medical History  Diagnosis Date  . Hypertension   . External hemorrhoid   . Multinodular goiter   . Positional vertigo   . Pericardial effusion   . History of fibrocystic disease of breast   . Osteopenia   . Colon polyps     tubulovillous adenoma that requred hemicolectomy (Dr Madilyn Fireman)  . Thyroid nodule     right  . Borderline diabetes 06/30/2011  . Arthritis     right knee     History   Social History  . Marital Status: Married    Spouse Name: N/A    Number of Children: N/A  . Years of Education: N/A   Occupational History  . Retired     [employer]   Social History Main Topics  . Smoking status: Never Smoker   . Smokeless tobacco: Never Used  . Alcohol Use: No  . Drug Use: No  . Sexual Activity: Not on file   Other Topics Concern  . Not on file   Social History Narrative  . No narrative on file    Past Surgical History  Procedure Laterality Date  . Hemicolectomy    . Total abdominal hysterectomy w/ bilateral salpingoophorectomy  1984  . Knee arthroscopy  12/20/11    right knee--Dr Blackmon    Family History  Problem Relation Age of Onset  . Hyperlipidemia    . Hypertension    . Esophageal cancer      Allergies  Allergen  Reactions  . Tetanus Toxoid Rash    Current Outpatient Prescriptions on File Prior to Visit  Medication Sig Dispense Refill  . amLODipine (NORVASC) 5 MG tablet TAKE 1 TABLET BY MOUTH EVERY DAY AFTER BREAKFAST  30 tablet  5  . [DISCONTINUED] Calcium Carbonate-Vitamin D (CALTRATE 600+D) 600-400 MG-UNIT per tablet Take 1 tablet by mouth 2 (two) times daily.       No current facility-administered medications on file prior to visit.    BP 136/80  Pulse 75  Temp(Src) 98.1 F (36.7 C) (Oral)  Resp 16  Ht 5\' 4"  (1.626 m)  Wt 161 lb 1.3 oz (73.065 kg)  BMI 27.64 kg/m2  SpO2 99%       Objective:   Physical Exam  Constitutional: She is oriented to person, place, and time. She appears well-developed and well-nourished. No distress.  HENT:  Head: Normocephalic and atraumatic.  Cardiovascular: Normal rate and regular rhythm.   No murmur heard. Pulmonary/Chest: Effort normal and breath sounds normal. No respiratory distress. She has no wheezes. She has no rales. She exhibits no tenderness.  Musculoskeletal: She exhibits no edema.  Neurological: She is alert and oriented to  person, place, and time.  Psychiatric: She has a normal mood and affect. Her behavior is normal. Judgment and thought content normal.          Assessment & Plan:

## 2013-08-05 DIAGNOSIS — K5909 Other constipation: Secondary | ICD-10-CM | POA: Insufficient documentation

## 2013-08-05 NOTE — Assessment & Plan Note (Signed)
BP Readings from Last 3 Encounters:  08/04/13 136/80  02/19/13 132/70  10/29/12 118/76   Stable on amlodipine.  Continue same.

## 2013-08-05 NOTE — Assessment & Plan Note (Signed)
Stable with prn miralax.  Advised pt to purchase generic miralax otc.

## 2013-08-05 NOTE — Assessment & Plan Note (Signed)
Clinically stable. Check A1C.  

## 2013-08-05 NOTE — Assessment & Plan Note (Signed)
Check TSH. Advised pt to arrange follow up with Dr. Everardo All.

## 2013-08-06 ENCOUNTER — Encounter: Payer: Self-pay | Admitting: Family

## 2013-08-11 ENCOUNTER — Ambulatory Visit (INDEPENDENT_AMBULATORY_CARE_PROVIDER_SITE_OTHER): Payer: Medicare Other | Admitting: Endocrinology

## 2013-08-11 ENCOUNTER — Encounter: Payer: Self-pay | Admitting: Endocrinology

## 2013-08-11 VITALS — BP 122/74 | HR 67 | Temp 98.3°F | Resp 12 | Ht 64.0 in | Wt 162.1 lb

## 2013-08-11 DIAGNOSIS — E042 Nontoxic multinodular goiter: Secondary | ICD-10-CM

## 2013-08-11 NOTE — Progress Notes (Signed)
  Subjective:    Patient ID: Alison [name], female    DOB: 1933/12/21, 79 y.o.   MRN: [id number]  HPI In 2008, pt was incidentally noted on chest CT, to have a right thyroid nodule.  bx in 2008 was benign.  Last [place] was in 2013.  It showed multinodular goiter, with the largest nodule (on the right) to be only slightly changed in size, if at all.  Pt says she does not notice the nodule.   No assoc dysphagia.   Past Medical History  Diagnosis Date  . Hypertension   . External hemorrhoid   . Multinodular goiter   . Positional vertigo   . Pericardial effusion   . History of fibrocystic disease of breast   . Osteopenia   . Colon polyps     tubulovillous adenoma that requred hemicolectomy (Dr Madilyn Fireman)  . Thyroid nodule     right  . Borderline diabetes 06/30/2011  . Arthritis     right knee     Past Surgical History  Procedure Laterality Date  . Hemicolectomy    . Total abdominal hysterectomy w/ bilateral salpingoophorectomy  1984  . Knee arthroscopy  12/20/11    right knee--Dr Blackmon    History   Social History  . Marital Status: Married    Spouse Name: N/A    Number of Children: N/A  . Years of Education: N/A   Occupational History  . Retired     [employer]   Social History Main Topics  . Smoking status: Never Smoker   . Smokeless tobacco: Never Used  . Alcohol Use: No  . Drug Use: No  . Sexual Activity: Not on file   Other Topics Concern  . Not on file   Social History Narrative  . No narrative on file    Current Outpatient Prescriptions on File Prior to Visit  Medication Sig Dispense Refill  . amLODipine (NORVASC) 5 MG tablet TAKE 1 TABLET BY MOUTH EVERY DAY AFTER BREAKFAST  30 tablet  5  . aspirin EC 81 MG tablet Take 81 mg by mouth daily.      Marland Kitchen triamcinolone cream (KENALOG) 0.5 % Apply 1 application topically as needed. For hand rash  30 g  0  . [DISCONTINUED] Calcium Carbonate-Vitamin D (CALTRATE 600+D) 600-400 MG-UNIT per tablet Take 1 tablet by mouth 2  (two) times daily.       No current facility-administered medications on file prior to visit.    Allergies  Allergen Reactions  . Tetanus Toxoid Rash   Family History  Problem Relation Age of Onset  . Hyperlipidemia    . Hypertension    . Esophageal cancer     BP 122/74  Pulse 67  Temp(Src) 98.3 F (36.8 C)  Resp 12  Ht 5\' 4"  (1.626 m)  Wt 162 lb 1.6 oz (73.528 kg)  BMI 27.81 kg/m2  SpO2 98%  Review of Systems Denies sob    Objective:   Physical Exam VITAL SIGNS:  See vs page GENERAL: no distress Neck: i can feel the top of the goiter only.  i can't tell details.    Lab Results  Component Value Date   TSH 1.515 08/04/2013      Assessment & Plan:  Multinodular goiter: ability to palpate is limited.  Euthyroid.   Palpitations, not thyroid-related.

## 2013-08-11 NOTE — Patient Instructions (Signed)
Let's recheck the ultrasound.  you will receive a phone call, about a day and time for an appointment.   most of the time, a "lumpy thyroid" will eventually become overactive.  this is usually a slow process, happening over the span of many years. Please return here in approx 2 years.

## 2013-08-20 ENCOUNTER — Encounter: Payer: Self-pay | Admitting: Family

## 2013-08-20 ENCOUNTER — Ambulatory Visit (INDEPENDENT_AMBULATORY_CARE_PROVIDER_SITE_OTHER): Payer: Medicare Other | Admitting: Family

## 2013-08-20 VITALS — BP 130/78 | HR 69 | Temp 98.1°F | Resp 16 | Ht 63.5 in | Wt 163.0 lb

## 2013-08-20 DIAGNOSIS — R7303 Prediabetes: Secondary | ICD-10-CM

## 2013-08-20 DIAGNOSIS — I10 Essential (primary) hypertension: Secondary | ICD-10-CM

## 2013-08-20 DIAGNOSIS — E042 Nontoxic multinodular goiter: Secondary | ICD-10-CM

## 2013-08-20 DIAGNOSIS — R7309 Other abnormal glucose: Secondary | ICD-10-CM

## 2013-08-20 DIAGNOSIS — Z0000 Encounter for general adult medical examination without abnormal findings: Secondary | ICD-10-CM

## 2013-08-20 NOTE — Patient Instructions (Signed)
Add fish oil. Avoid concentrated sweets and cut down on carbohydrates (bread/rice/pasta). Follow up in 3 months.

## 2013-08-20 NOTE — Progress Notes (Signed)
Subjective:    Patient ID: Alison [name], female    DOB: 1934/01/06, 79 y.o.   MRN: [id number]  HPI  Subjective:   Patient here for Medicare annual wellness visit and management of other chronic and acute problems.  Borderline DM- Last A1C 6.0.  HTN- maintained on amlodipine  Multinodular goiter- recently saw Dr. Everardo All, Endo and a follow up ultrasound was ordered.    Immunizations: declines Diet: reports healthy diet Exercise: Reports that she walks regularly Colonoscopy: due next year  Dexa: completed 2012 Pap Smear: 2012- reports s/p TAH/BSO Mammogram: up to date   Risk factors: at risk for CAD due to hx or HTN, age, overweight  Roster of Physicians Providing Medical Care to Patient: Dr. Everardo All  Activities of Daily Living  In your present state of health, do you have any difficulty performing the following activities? Preparing food and eating?: No  Bathing yourself: No  Getting dressed: No  Using the toilet:No  Moving around from place to place: No  In the past year have you fallen or had a near fall?:No    Home Safety: Has smoke detector and wears seat belts. + firearms- all locked. No excess sun exposure.  Diet and Exercise  Current exercise habits:  Dietary issues discussed: healthy diet   Depression Screen  (Note: if answer to either of the following is "Yes", then a more complete depression screening is indicated)  Q1: Over the past two weeks, have you felt down, depressed or hopeless?no  Q2: Over the past two weeks, have you felt little interest or pleasure in doing things? no   The following portions of the patient's history were reviewed and updated as appropriate: allergies, current medications, past family history, past medical history, past social history, past surgical history and problem list.  Objective:   Vision: declines referral for eye exam.  See nursing Hearing:  Able to hear forced whisper at 6 feet. Body mass index:see  vitals Cognitive Impairment Assessment: cognition, memory and judgment appear normal.   Assessment:    Plan:   During the course of the visit the patient was educated and counseled about appropriate screening and preventive services including:        Fall prevention   Screening mammography   Vaccines / LABS Declines vaccines Patient Instructions (the written plan) was given to the patient.       Review of Systems See HPI    Objective:   Physical Exam  Physical Exam  Constitutional: She is oriented to person, place, and time. She appears well-developed and well-nourished. No distress.  HENT:  Head: Normocephalic and atraumatic.  Right Ear: Tympanic membrane and ear canal normal.  Left Ear: Tympanic membrane and ear canal normal.  Mouth/Throat: Oropharynx is clear and moist.  Eyes: Pupils are equal, round, and reactive to light. No scleral icterus.  Neck: Normal range of motion. No thyromegaly present.  Cardiovascular: Normal rate and regular rhythm.   No murmur heard. Pulmonary/Chest: Effort normal and breath sounds normal. No respiratory distress. He has no wheezes. She has no rales. She exhibits no tenderness.  Abdominal: Soft. Bowel sounds are normal. He exhibits no distension and no mass. There is no tenderness. There is no rebound and no guarding.  Musculoskeletal: She exhibits no edema.  Lymphadenopathy:    She has no cervical adenopathy.  Neurological: She is alert and oriented to person, place, and time. She has normal reflexes. She exhibits normal muscle tone. Coordination normal.  Skin: Skin is warm and  dry.  Psychiatric: She has a normal mood and affect. Her behavior is normal. Judgment and thought content normal.  Breasts: Examined lying Right: Without masses, retractions, discharge or axillary adenopathy.  left: Without masses, retractions, discharge or axillary adenopathy.          Assessment & Plan:         Assessment & Plan:

## 2013-08-20 NOTE — Progress Notes (Signed)
Pre visit review using our clinic review tool, if applicable. No additional management support is needed unless otherwise documented below in the visit note. 

## 2013-08-25 NOTE — Assessment & Plan Note (Signed)
BP Readings from Last 3 Encounters:  08/20/13 130/78  08/11/13 122/74  08/04/13 136/80   BP stable on current meds, continue same.

## 2013-08-25 NOTE — Assessment & Plan Note (Signed)
Clinically stable, management per endo 

## 2013-08-25 NOTE — Assessment & Plan Note (Addendum)
Discussed diabetic diet, A1C at goal at 6.0.

## 2013-09-10 ENCOUNTER — Ambulatory Visit
Admission: RE | Admit: 2013-09-10 | Discharge: 2013-09-10 | Disposition: A | Discharge status: Home / Self Care | Payer: Medicare Other | Source: Ambulatory Visit | Attending: Endocrinology | Admitting: Endocrinology

## 2013-09-17 ENCOUNTER — Telehealth: Payer: Self-pay | Admitting: Family

## 2013-09-17 NOTE — Telephone Encounter (Signed)
She had an ultrasound of her neck last week.   Dr Everardo All told her that her thyroid is bigger.  She doesn't feel anything different.  She would like to have another ultrasound just to be sure because he wants her to have surgery and she wants to be sure before this is done.

## 2013-09-18 NOTE — Telephone Encounter (Signed)
I reviewed ultrasound.  Mass on thyroid has enlarged.  I do not think that there would be any value in repeating the ultrasound as the mass was carefully measured.   I would advise her to talk it over further with Dr. Everardo All if she has questions re: surgical recommendation.

## 2013-09-18 NOTE — Telephone Encounter (Signed)
Left a message with pts spouse to return our call

## 2013-09-18 NOTE — Telephone Encounter (Signed)
Patient informed and voiced understanding

## 2013-10-14 ENCOUNTER — Telehealth: Payer: Self-pay

## 2013-10-14 DIAGNOSIS — E042 Nontoxic multinodular goiter: Secondary | ICD-10-CM

## 2013-10-14 NOTE — Telephone Encounter (Signed)
Pt called requesting information concerning surgical recommendation for thyroid. Please see not from Debbrah Alar for further info and advise, Thanks!

## 2013-10-14 NOTE — Telephone Encounter (Signed)
Why don't you have an appointment with a surgery dr, who can give you more information.  Ok?

## 2013-10-15 NOTE — Telephone Encounter (Signed)
Ok, i have requested an appointment for you.  you will receive a phone call, about a day and time for an appointment.

## 2013-10-15 NOTE — Telephone Encounter (Signed)
Pt states that she would be ok with meeting with surgery dr.

## 2013-10-17 NOTE — Telephone Encounter (Signed)
Pt informed

## 2013-10-21 ENCOUNTER — Telehealth: Payer: Self-pay | Admitting: Family

## 2013-10-21 DIAGNOSIS — E042 Nontoxic multinodular goiter: Secondary | ICD-10-CM

## 2013-10-21 NOTE — Telephone Encounter (Signed)
Please enter a referral for [facility] as they will not accept the referral from Dr Loanne Drilling

## 2013-10-31 ENCOUNTER — Ambulatory Visit (INDEPENDENT_AMBULATORY_CARE_PROVIDER_SITE_OTHER): Payer: Medicare Other | Admitting: Surgery

## 2013-10-31 ENCOUNTER — Encounter (INDEPENDENT_AMBULATORY_CARE_PROVIDER_SITE_OTHER): Payer: Self-pay | Admitting: Surgery

## 2013-10-31 VITALS — BP 150/78 | HR 88 | Temp 98.8°F | Resp 18 | Ht 63.0 in | Wt 161.0 lb

## 2013-10-31 DIAGNOSIS — E042 Nontoxic multinodular goiter: Secondary | ICD-10-CM

## 2013-10-31 NOTE — Patient Instructions (Signed)

## 2013-10-31 NOTE — Progress Notes (Signed)
General Surgery [facility]  Chief Complaint  Patient presents with  . New Evaluation    evaluate thyroid goiter - referral from Debbrah Alar, NP    HISTORY: Patient is a 80 year old female referred by her primary care provider and her endocrinologist for evaluation of multinodular thyroid goiter. Patient has been followed with sequential ultrasound scanning. There is a dominant nodule in the lower right thyroid lobe which has continued to increase in size and now measures 3.7 cm in maximum dimension. This nodule has been previously biopsied several years ago and shows findings consistent with nonneoplastic goiter. The left thyroid lobe is normal in size but contains a 13 mm nodule which has not been biopsied. Thyroid function test are normal with a TSH level of 1.5.  Patient has never been on thyroid medication. She has had no prior head or neck surgery. There is no family history of thyroid disease. There is no family history of other endocrine neoplasm.  Patient denies any compressive symptoms.  Past Medical History  Diagnosis Date  . Hypertension   . External hemorrhoid   . Multinodular goiter   . Positional vertigo   . Pericardial effusion   . History of fibrocystic disease of breast   . Osteopenia   . 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 Thyroid nodule     right  . Borderline diabetes 06/30/2011  . Arthritis     right knee     Current Outpatient Prescriptions  Medication Sig Dispense Refill  . amLODipine (NORVASC) 5 MG tablet TAKE 1 TABLET BY MOUTH EVERY DAY AFTER BREAKFAST  30 tablet  5  . aspirin EC 81 MG tablet Take 81 mg by mouth daily.      . fish oil-omega-3 fatty acids 1000 MG capsule Take 2 g by mouth daily.      Marland Kitchen triamcinolone cream (KENALOG) 0.5 % Apply 1 application topically as needed. For hand rash  30 g  0  . [DISCONTINUED] Calcium Carbonate-Vitamin D (CALTRATE 600+D) 600-400 MG-UNIT per  tablet Take 1 tablet by mouth 2 (two) times daily.       No current facility-administered medications for this visit.    Allergies  Allergen Reactions  . Tetanus Toxoid Rash    Family History  Problem Relation Age of Onset  . Hyperlipidemia    . Hypertension    . Esophageal cancer    . Hypertension Mother   . Cancer Father     prostate    History   Social History  . Marital Status: Married    Spouse Name: N/A    Number of Children: N/A  . Years of Education: N/A   Occupational History  . Retired     [place] Topics  . Smoking status: Never Smoker   . Smokeless tobacco: Never Used  . Alcohol Use: No  . Drug Use: No  . Sexual Activity: None   Other Topics Concern  . None   Social History Narrative  . None    REVIEW OF SYSTEMS - PERTINENT POSITIVES ONLY: Denies compressive symptoms. Denies tremor. Denies palpitations.  EXAM: Filed Vitals:   10/31/13 1330  BP: 150/78  Pulse: 88  Temp: 98.8 F (37.1 C)  Resp: 18    GENERAL: well-developed, well-nourished, no acute distress HEENT: normocephalic; pupils equal and reactive; sclerae clear; dentition good; mucous membranes moist NECK:  Palpable mass inferior right thyroid lobe extending  beneath the clavicle, smooth, mobile with swallowing, nontender; left lobe without palpable abnormality; asymmetric on extension; no palpable anterior or posterior cervical lymphadenopathy; no supraclavicular masses; no tenderness CHEST: clear to auscultation bilaterally without rales, rhonchi, or wheezes CARDIAC: regular rate and rhythm without significant murmur; peripheral pulses are full EXT:  non-tender without edema; no deformity NEURO: no gross focal deficits; no sign of tremor   LABORATORY RESULTS: See [facility] (CHL-Epic) for most recent results  RADIOLOGY RESULTS: See [facility] (CHL-Epic) for most recent results  IMPRESSION: #1 multinodular thyroid goiter #2 dominant right  thyroid nodule, 3.7 cm, with interval enlargement  PLAN: The patient and I reviewed the above findings at length. We reviewed her ultrasound report. I provided her with written literature regarding thyroid disease and thyroid surgery to review at home.  I have recommended total thyroidectomy for management of bilateral thyroid nodules with interval enlargement. Alternatively, as this is likely benign disease, she could be followed with sequential ultrasound scanning. However, given the increasing size of the right inferior nodule, she may send developed some level of compressive symptoms. I have recommended total thyroidectomy as opposed to lobectomy due to the presence of bilateral nodules.  Patient and I have discussed the procedure at length. We discussed the hospital stay. We discussed potential complications including injury to recurrent laryngeal nerves and injury to parathyroid glands. We discussed the need for lifelong thyroid hormone replacement postoperatively. She understands. She would like to discuss this with her family prior to making a decision.  Patient will contact us if she elects to proceed with surgery. Otherwise we will plan to see her back at the end of the year with a repeat thyroid ultrasound.  Earnstine Regal, MD, [facility]  Primary Care Physician: Jeb Levering, Philbert Riser, MD

## 2013-11-07 ENCOUNTER — Ambulatory Visit: Payer: Medicare Other | Admitting: Family

## 2013-11-10 ENCOUNTER — Encounter: Payer: Self-pay | Admitting: Family

## 2013-11-10 ENCOUNTER — Ambulatory Visit (INDEPENDENT_AMBULATORY_CARE_PROVIDER_SITE_OTHER): Payer: Medicare Other | Admitting: Family

## 2013-11-10 VITALS — BP 116/72 | HR 80 | Temp 97.6°F | Resp 16 | Ht 63.5 in | Wt 160.1 lb

## 2013-11-10 DIAGNOSIS — I10 Essential (primary) hypertension: Secondary | ICD-10-CM

## 2013-11-10 DIAGNOSIS — R7309 Other abnormal glucose: Secondary | ICD-10-CM

## 2013-11-10 DIAGNOSIS — E042 Nontoxic multinodular goiter: Secondary | ICD-10-CM

## 2013-11-10 DIAGNOSIS — R7303 Prediabetes: Secondary | ICD-10-CM

## 2013-11-10 LAB — BASIC METABOLIC PANEL
BUN: 18 mg/dL (ref 6–23)
CO2: 26 mEq/L (ref 19–32)
Calcium: 9.5 mg/dL (ref 8.4–10.5)
Chloride: 106 mEq/L (ref 96–112)
Creat: 0.79 mg/dL (ref 0.50–1.10)
Glucose, Bld: 107 mg/dL — ABNORMAL HIGH (ref 70–99)
POTASSIUM: 3.9 meq/L (ref 3.5–5.3)
SODIUM: 142 meq/L (ref 135–145)

## 2013-11-10 LAB — HEMOGLOBIN A1C
HEMOGLOBIN A1C: 6.4 % — AB (ref <5.7)
MEAN PLASMA GLUCOSE: 137 mg/dL — AB (ref <117)

## 2013-11-10 MED ORDER — AMLODIPINE BESYLATE 5 MG PO TABS
ORAL_TABLET | ORAL | Status: DC
Start: 2013-11-10 — End: 2013-12-05

## 2013-11-10 NOTE — Assessment & Plan Note (Signed)
Check follow up A1C.

## 2013-11-10 NOTE — Patient Instructions (Signed)
You can try claritin 10mg  once daily for nasal drainage- let me know if this does not help.   Follow up in 3 months.

## 2013-11-10 NOTE — Progress Notes (Signed)
Subjective:    Patient ID: Alison [name], female    DOB: 1934/09/22, 80 y.o.   MRN: [id number]  HPI  Alison [name] is a 80 yr old female who presents today for follow up.   HTN- she continues amlodipine. She denies CP, SOB or swelling.  Multinodular goiter- she was referred to surgeon from Endo. They have recommended thyroidectomy. Patient plans to proceed in the next few months.   Borderline DM-  Last A1C in end of October was 6.0.    Review of Systems See HPI  Past Medical History  Diagnosis Date  . Hypertension   . External hemorrhoid   . Multinodular goiter   . Positional vertigo   . Pericardial effusion   . History of fibrocystic disease of breast   . Osteopenia   . 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 Thyroid nodule     right  . Borderline diabetes 06/30/2011  . Arthritis     right knee     History   Social History  . Marital Status: Married    Spouse Name: N/A    Number of Children: N/A  . Years of Education: N/A   Occupational History  . Retired     [place] Topics  . Smoking status: Never Smoker   . Smokeless tobacco: Never Used  . Alcohol Use: No  . Drug Use: No  . Sexual Activity: Not on file   Other Topics Concern  . Not on file   Social History Narrative  . No narrative on file    Past Surgical History  Procedure Laterality Date  . Hemicolectomy    . Total abdominal hysterectomy w/ bilateral salpingoophorectomy  1984  . Knee arthroscopy  12/20/11    right knee--Dr Blackmon    Family History  Problem Relation Age of Onset  . Hyperlipidemia    . Hypertension    . Esophageal cancer    . Hypertension Mother   . Cancer Father     prostate    Allergies  Allergen Reactions  . Tetanus Toxoid Rash    Current Outpatient Prescriptions on File Prior to Visit  Medication Sig Dispense Refill  . aspirin EC 81 MG tablet Take 81 mg by mouth daily.      . fish  oil-omega-3 fatty acids 1000 MG capsule Take 2 g by mouth daily.      Marland Kitchen triamcinolone cream (KENALOG) 0.5 % Apply 1 application topically as needed. For hand rash  30 g  0  . [DISCONTINUED] Calcium Carbonate-Vitamin D (CALTRATE 600+D) 600-400 MG-UNIT per tablet Take 1 tablet by mouth 2 (two) times daily.       No current facility-administered medications on file prior to visit.    BP 116/72  Pulse 80  Temp(Src) 97.6 F (36.4 C) (Oral)  Resp 16  Ht 5' 3.5" (1.613 m)  Wt 160 lb 1.9 oz (72.63 kg)  BMI 27.92 kg/m2  SpO2 98%       Objective:   Physical Exam  Constitutional: She is oriented to person, place, and time. She appears well-developed and well-nourished. No distress.  Cardiovascular: Normal rate and regular rhythm.   No murmur heard. Pulmonary/Chest: Effort normal and breath sounds normal. No respiratory distress. She has no wheezes. She has no rales. She exhibits no tenderness.  Musculoskeletal: She exhibits no edema.  Neurological: She is alert and oriented to person, place, and time.  Psychiatric: She has a normal mood and affect. Her behavior is normal. Judgment and thought content normal.          Assessment & Plan:

## 2013-11-10 NOTE — Assessment & Plan Note (Addendum)
BP Readings from Last 3 Encounters:  11/10/13 116/72  10/31/13 150/78  08/20/13 130/78   BP stable on amlodipine, continue same.  Obtain bmet.

## 2013-11-10 NOTE — Assessment & Plan Note (Signed)
Patient will contact her surgeon re: scheduling thyroidectomy.

## 2013-11-10 NOTE — Progress Notes (Signed)
Pre visit review using our clinic review tool, if applicable. No additional management support is needed unless otherwise documented below in the visit note.,  

## 2013-11-11 ENCOUNTER — Telehealth: Payer: Self-pay | Admitting: Family

## 2013-11-11 NOTE — Telephone Encounter (Signed)
Relevant patient education mailed to patient.  

## 2013-11-13 ENCOUNTER — Encounter: Payer: Self-pay | Admitting: Family

## 2013-11-28 ENCOUNTER — Other Ambulatory Visit (INDEPENDENT_AMBULATORY_CARE_PROVIDER_SITE_OTHER): Payer: Self-pay | Admitting: Surgery

## 2013-12-05 ENCOUNTER — Encounter (HOSPITAL_COMMUNITY): Payer: Self-pay | Admitting: Pharmacy Technician

## 2013-12-09 ENCOUNTER — Other Ambulatory Visit (HOSPITAL_COMMUNITY): Payer: Self-pay | Admitting: Surgery

## 2013-12-09 NOTE — Progress Notes (Signed)
03-11-13 echo epic ekg 02-19-13 epic lov dr Stanford Breed cardiology 02-19-13 epic

## 2013-12-09 NOTE — Patient Instructions (Addendum)
[place]  12/09/2013   Your procedure is scheduled on: Monday March 9th  Report to [facility] at  930 AM.  Call this number if you have problems the morning of surgery [phone]   Remember:  Do not eat food or drink liquids :After Midnight.     Take these medicines the morning of surgery with A SIP OF WATER: amlodipine                                SEE [facility] PREPARING FOR SURGERY SHEET             You may not have any metal on your body including hair pins and piercings  Do not wear jewelry, make-up.  Do not wear lotions, powders, or perfumes. No  Deodorant is to be worn.   Men may shave face and neck.  Do not bring valuables to the hospital. [place].  Contacts, dentures or bridgework may not be worn into surgery.  Leave suitcase in the car. After surgery it may be brought to your room.  For patients admitted to the hospital, checkout time is 11:00 AM the day of discharge.    Please read over the following fact sheets that you were given: [facility] Preparing for surgery sheet.  Call Zelphia Cairo RN pre op nurse if needed [phone]    FAILURE TO FOLLOW THESE INSTRUCTIONS MAY RESULT IN THE CANCELLATION OF YOUR SURGERY.  PATIENT SIGNATURE___________________________________________  NURSE SIGNATURE____________________________________________

## 2013-12-10 ENCOUNTER — Encounter (HOSPITAL_COMMUNITY)
Admission: RE | Admit: 2013-12-10 | Discharge: 2013-12-10 | Disposition: A | Discharge status: Home / Self Care | Payer: Medicare Other | Source: Ambulatory Visit | Attending: Surgery | Admitting: Surgery

## 2013-12-10 ENCOUNTER — Encounter (HOSPITAL_COMMUNITY): Payer: Self-pay

## 2013-12-10 ENCOUNTER — Ambulatory Visit (HOSPITAL_COMMUNITY)
Admission: RE | Admit: 2013-12-10 | Discharge: 2013-12-10 | Disposition: A | Discharge status: Home / Self Care | Payer: Medicare Other | Source: Ambulatory Visit | Attending: Surgery | Admitting: Surgery

## 2013-12-10 DIAGNOSIS — Z01812 Encounter for preprocedural laboratory examination: Secondary | ICD-10-CM | POA: Insufficient documentation

## 2013-12-10 DIAGNOSIS — Z01818 Encounter for other preprocedural examination: Secondary | ICD-10-CM | POA: Insufficient documentation

## 2013-12-10 LAB — BASIC METABOLIC PANEL
BUN: 13 mg/dL (ref 6–23)
CALCIUM: 9.7 mg/dL (ref 8.4–10.5)
CO2: 26 mEq/L (ref 19–32)
CREATININE: 0.88 mg/dL (ref 0.50–1.10)
Chloride: 104 mEq/L (ref 96–112)
GFR calc Af Amer: 72 mL/min — ABNORMAL LOW (ref >90)
GFR calc non Af Amer: 62 mL/min — ABNORMAL LOW (ref >90)
GLUCOSE: 111 mg/dL — AB (ref 70–99)
Potassium: 4.4 mEq/L (ref 3.7–5.3)
Sodium: 142 mEq/L (ref 137–147)

## 2013-12-10 LAB — CBC
HCT: 42.9 % (ref 36.0–46.0)
Hemoglobin: 14.4 g/dL (ref 12.0–15.0)
MCH: 30.1 pg (ref 26.0–34.0)
MCHC: 33.6 g/dL (ref 30.0–36.0)
MCV: 89.7 fL (ref 78.0–100.0)
PLATELETS: 167 10*3/uL (ref 150–400)
RBC: 4.78 MIL/uL (ref 3.87–5.11)
RDW: 13.2 % (ref 11.5–15.5)
WBC: 5.2 10*3/uL (ref 4.0–10.5)

## 2013-12-10 NOTE — Progress Notes (Signed)
Quick Note:  These results are acceptable for scheduled surgery.  Raea Magallon M. Graig Hessling, MD, FACS [facility] Office: [phone]   ______ 

## 2013-12-15 ENCOUNTER — Observation Stay (HOSPITAL_COMMUNITY)
Admission: RE | Admit: 2013-12-15 | Discharge: 2013-12-16 | Disposition: A | Discharge status: Home / Self Care | Payer: Medicare Other | Source: Ambulatory Visit | Attending: Surgery | Admitting: Surgery

## 2013-12-15 ENCOUNTER — Inpatient Hospital Stay (HOSPITAL_COMMUNITY): Payer: Medicare Other | Admitting: *Deleted

## 2013-12-15 ENCOUNTER — Encounter (HOSPITAL_COMMUNITY): Payer: Self-pay | Admitting: *Deleted

## 2013-12-15 ENCOUNTER — Encounter (HOSPITAL_COMMUNITY): Payer: Medicare Other | Admitting: *Deleted

## 2013-12-15 ENCOUNTER — Encounter (HOSPITAL_COMMUNITY)
Admission: RE | Disposition: A | Discharge status: Home / Self Care | Payer: Self-pay | Source: Ambulatory Visit | Attending: Surgery

## 2013-12-15 DIAGNOSIS — Z7982 Long term (current) use of aspirin: Secondary | ICD-10-CM | POA: Insufficient documentation

## 2013-12-15 DIAGNOSIS — E042 Nontoxic multinodular goiter: Principal | ICD-10-CM | POA: Diagnosis present

## 2013-12-15 DIAGNOSIS — I10 Essential (primary) hypertension: Secondary | ICD-10-CM | POA: Insufficient documentation

## 2013-12-15 DIAGNOSIS — Z79899 Other long term (current) drug therapy: Secondary | ICD-10-CM | POA: Insufficient documentation

## 2013-12-15 DIAGNOSIS — Z90710 Acquired absence of both cervix and uterus: Secondary | ICD-10-CM | POA: Insufficient documentation

## 2013-12-15 DIAGNOSIS — E049 Nontoxic goiter, unspecified: Secondary | ICD-10-CM

## 2013-12-15 DIAGNOSIS — Z9049 Acquired absence of other specified parts of digestive tract: Secondary | ICD-10-CM | POA: Insufficient documentation

## 2013-12-15 DIAGNOSIS — M899 Disorder of bone, unspecified: Secondary | ICD-10-CM | POA: Insufficient documentation

## 2013-12-15 DIAGNOSIS — M949 Disorder of cartilage, unspecified: Secondary | ICD-10-CM

## 2013-12-15 HISTORY — PX: THYROIDECTOMY: SHX17

## 2013-12-15 SURGERY — THYROIDECTOMY
Anesthesia: General | Site: Neck

## 2013-12-15 MED ORDER — GLYCOPYRROLATE 0.2 MG/ML IJ SOLN
INTRAMUSCULAR | Status: DC | PRN
  Administered 2013-12-15: 0.6 mg via INTRAVENOUS

## 2013-12-15 MED ORDER — MIDAZOLAM HCL 2 MG/2ML IJ SOLN
INTRAMUSCULAR | Status: AC
  Filled 2013-12-15: qty 2

## 2013-12-15 MED ORDER — MIDAZOLAM HCL 5 MG/5ML IJ SOLN
INTRAMUSCULAR | Status: DC | PRN
  Administered 2013-12-15: 1 mg via INTRAVENOUS

## 2013-12-15 MED ORDER — ONDANSETRON HCL 4 MG/2ML IJ SOLN
INTRAMUSCULAR | Status: DC | PRN
  Administered 2013-12-15: 4 mg via INTRAVENOUS

## 2013-12-15 MED ORDER — FENTANYL CITRATE 0.05 MG/ML IJ SOLN
INTRAMUSCULAR | Status: AC
  Filled 2013-12-15: qty 5

## 2013-12-15 MED ORDER — PROPOFOL 10 MG/ML IV BOLUS
INTRAVENOUS | Status: DC | PRN
  Administered 2013-12-15: 150 mg via INTRAVENOUS

## 2013-12-15 MED ORDER — CISATRACURIUM BESYLATE (PF) 10 MG/5ML IV SOLN
INTRAVENOUS | Status: DC | PRN
  Administered 2013-12-15: 2 mg via INTRAVENOUS
  Administered 2013-12-15: 4 mg via INTRAVENOUS

## 2013-12-15 MED ORDER — HYDROMORPHONE HCL PF 1 MG/ML IJ SOLN: 0.2500 mg | INTRAMUSCULAR | Status: DC | PRN

## 2013-12-15 MED ORDER — PROPOFOL 10 MG/ML IV BOLUS
INTRAVENOUS | Status: AC
  Filled 2013-12-15: qty 20

## 2013-12-15 MED ORDER — SUCCINYLCHOLINE CHLORIDE 20 MG/ML IJ SOLN
INTRAMUSCULAR | Status: DC | PRN
  Administered 2013-12-15: 80 mg via INTRAVENOUS

## 2013-12-15 MED ORDER — ONDANSETRON HCL 4 MG PO TABS: 4.0000 mg | ORAL_TABLET | Freq: Four times a day (QID) | ORAL | Status: DC | PRN

## 2013-12-15 MED ORDER — CALCIUM CARBONATE 1250 (500 CA) MG PO TABS
2.0000 | ORAL_TABLET | Freq: Three times a day (TID) | ORAL | Status: DC
  Administered 2013-12-15: 1000 mg via ORAL
  Filled 2013-12-15 (×5): qty 2

## 2013-12-15 MED ORDER — KCL IN DEXTROSE-NACL 20-5-0.45 MEQ/L-%-% IV SOLN
INTRAVENOUS | Status: DC
  Administered 2013-12-15: 18:00:00 via INTRAVENOUS
  Filled 2013-12-15 (×2): qty 1000

## 2013-12-15 MED ORDER — CEFAZOLIN SODIUM-DEXTROSE 2-3 GM-% IV SOLR
2.0000 g | INTRAVENOUS | Status: AC
  Administered 2013-12-15: 2 g via INTRAVENOUS

## 2013-12-15 MED ORDER — FENTANYL CITRATE 0.05 MG/ML IJ SOLN
INTRAMUSCULAR | Status: DC | PRN
  Administered 2013-12-15 (×5): 50 ug via INTRAVENOUS

## 2013-12-15 MED ORDER — DEXAMETHASONE SODIUM PHOSPHATE 4 MG/ML IJ SOLN
INTRAMUSCULAR | Status: DC | PRN
  Administered 2013-12-15: 10 mg via INTRAVENOUS

## 2013-12-15 MED ORDER — LACTATED RINGERS IV SOLN
INTRAVENOUS | Status: DC
  Administered 2013-12-15: 1000 mL via INTRAVENOUS
  Administered 2013-12-15: 14:00:00 via INTRAVENOUS

## 2013-12-15 MED ORDER — ONDANSETRON HCL 4 MG/2ML IJ SOLN: 4.0000 mg | Freq: Four times a day (QID) | INTRAMUSCULAR | Status: DC | PRN

## 2013-12-15 MED ORDER — ACETAMINOPHEN 325 MG PO TABS: 650.0000 mg | ORAL_TABLET | ORAL | Status: DC | PRN

## 2013-12-15 MED ORDER — AMLODIPINE BESYLATE 5 MG PO TABS
5.0000 mg | ORAL_TABLET | Freq: Every morning | ORAL | Status: DC
  Filled 2013-12-15: qty 1

## 2013-12-15 MED ORDER — HYDROMORPHONE HCL PF 1 MG/ML IJ SOLN: 1.0000 mg | INTRAMUSCULAR | Status: DC | PRN

## 2013-12-15 MED ORDER — LACTATED RINGERS IV SOLN: INTRAVENOUS | Status: DC

## 2013-12-15 MED ORDER — HYDROCODONE-ACETAMINOPHEN 5-325 MG PO TABS: 1.0000 | ORAL_TABLET | ORAL | Status: DC | PRN

## 2013-12-15 MED ORDER — NEOSTIGMINE METHYLSULFATE 1 MG/ML IJ SOLN
INTRAMUSCULAR | Status: DC | PRN
  Administered 2013-12-15: 4 mg via INTRAVENOUS

## 2013-12-15 MED ORDER — PHENYLEPHRINE HCL 10 MG/ML IJ SOLN
INTRAMUSCULAR | Status: DC | PRN
  Administered 2013-12-15: 40 ug via INTRAVENOUS

## 2013-12-15 MED ORDER — CEFAZOLIN SODIUM-DEXTROSE 2-3 GM-% IV SOLR
INTRAVENOUS | Status: AC
  Filled 2013-12-15: qty 50

## 2013-12-15 SURGICAL SUPPLY — 39 items
APL SKNCLS STERI-STRIP NONHPOA (GAUZE/BANDAGES/DRESSINGS) ×1
ATTRACTOMAT 16X20 MAGNETIC DRP (DRAPES) ×3 IMPLANT
BENZOIN TINCTURE PRP APPL 2/3 (GAUZE/BANDAGES/DRESSINGS) ×3 IMPLANT
BLADE HEX COATED 2.75 (ELECTRODE) ×3 IMPLANT
BLADE SURG 15 STRL LF DISP TIS (BLADE) ×1 IMPLANT
BLADE SURG 15 STRL SS (BLADE) ×3
CHLORAPREP W/TINT 26ML (MISCELLANEOUS) ×3 IMPLANT
CLIP TI MEDIUM 6 (CLIP) ×8 IMPLANT
CLIP TI WIDE RED SMALL 6 (CLIP) ×10 IMPLANT
CLOSURE WOUND 1/2 X4 (GAUZE/BANDAGES/DRESSINGS) ×1
DISSECTOR ROUND CHERRY 3/8 STR (MISCELLANEOUS) IMPLANT
DRAPE PED LAPAROTOMY (DRAPES) ×3 IMPLANT
DRESSING SURGICEL FIBRLLR 1X2 (HEMOSTASIS) ×1 IMPLANT
DRSG SURGICEL FIBRILLAR 1X2 (HEMOSTASIS) ×3
ELECT REM PT RETURN 9FT ADLT (ELECTROSURGICAL) ×3
ELECTRODE REM PT RTRN 9FT ADLT (ELECTROSURGICAL) ×1 IMPLANT
GAUZE SPONGE 4X4 16PLY XRAY LF (GAUZE/BANDAGES/DRESSINGS) ×3 IMPLANT
GLOVE SURG ORTHO 8.0 STRL STRW (GLOVE) ×3 IMPLANT
GOWN STRL REUS W/TWL XL LVL3 (GOWN DISPOSABLE) ×6 IMPLANT
KIT BASIN OR (CUSTOM PROCEDURE TRAY) ×3 IMPLANT
MANIFOLD NEPTUNE II (INSTRUMENTS) ×2 IMPLANT
NS IRRIG 1000ML POUR BTL (IV SOLUTION) ×3 IMPLANT
PACK BASIC VI WITH GOWN DISP (CUSTOM PROCEDURE TRAY) ×3 IMPLANT
PENCIL BUTTON HOLSTER BLD 10FT (ELECTRODE) ×3 IMPLANT
SHEARS HARMONIC 9CM CVD (BLADE) ×3 IMPLANT
SPONGE GAUZE 4X4 12PLY (GAUZE/BANDAGES/DRESSINGS) ×2 IMPLANT
STAPLER VISISTAT 35W (STAPLE) IMPLANT
STRIP CLOSURE SKIN 1/2X4 (GAUZE/BANDAGES/DRESSINGS) ×2 IMPLANT
SUT MNCRL AB 4-0 PS2 18 (SUTURE) ×3 IMPLANT
SUT SILK 2 0 (SUTURE)
SUT SILK 2-0 18XBRD TIE 12 (SUTURE) IMPLANT
SUT SILK 3 0 (SUTURE)
SUT SILK 3-0 18XBRD TIE 12 (SUTURE) IMPLANT
SUT VIC AB 3-0 SH 18 (SUTURE) ×6 IMPLANT
SYR BULB IRRIGATION 50ML (SYRINGE) ×3 IMPLANT
TAPE CLOTH SURG 4X10 WHT LF (GAUZE/BANDAGES/DRESSINGS) ×2 IMPLANT
TOWEL OR 17X26 10 PK STRL BLUE (TOWEL DISPOSABLE) ×3 IMPLANT
TOWEL OR NON WOVEN STRL DISP B (DISPOSABLE) ×3 IMPLANT
YANKAUER SUCT BULB TIP 10FT TU (MISCELLANEOUS) ×3 IMPLANT

## 2013-12-15 NOTE — Transfer of Care (Signed)
Immediate Anesthesia Transfer of Care Note  Patient: Alison [name]  Procedure(s) Performed: Procedure(s): TOTAL THYROIDECTOMY (N/A)  Patient Location: PACU  Anesthesia Type:General  Level of Consciousness: Patient easily awoken, sedated, comfortable, cooperative, following commands, responds to stimulation.   Airway & Oxygen Therapy: Patient spontaneously breathing, ventilating well, oxygen via simple oxygen mask.  Post-op Assessment: Report given to PACU RN, vital signs reviewed and stable, moving all extremities.   Post vital signs: Reviewed and stable.  Complications: No apparent anesthesia complications

## 2013-12-15 NOTE — Progress Notes (Signed)
PACU note-----pt sedated; not able to arouse; SAO2 80's, oral airway inserted by Bailey Mech, CRNA

## 2013-12-15 NOTE — Preoperative (Signed)
Beta Blockers   Reason not to administer Beta Blockers:Not Applicable, not on home BB 

## 2013-12-15 NOTE — Progress Notes (Signed)
Pt opens eyes and SAO2 100, resps 15/min; arouses, oral airway removed

## 2013-12-15 NOTE — H&P (Signed)
Alison [name] is an 80 y.o. female.    General Surgery [facility]  Chief Complaint: multi-nodular goiter with interval enlargement of dominant nodule  HPI: Patient is a 80 year old female referred by her primary care provider and her endocrinologist for evaluation of multinodular thyroid goiter. Patient has been followed with sequential ultrasound scanning. There is a dominant nodule in the lower right thyroid lobe which has continued to increase in size and now measures 3.7 cm in maximum dimension. This nodule has been previously biopsied several years ago and shows findings consistent with nonneoplastic goiter. The left thyroid lobe is normal in size but contains a 13 mm nodule which has not been biopsied. Thyroid function test are normal with a TSH level of 1.5.  Patient has never been on thyroid medication. She has had no prior head or neck surgery. There is no family history of thyroid disease. There is no family history of other endocrine neoplasm.  Patient denies any compressive symptoms.  Past Medical History  Diagnosis Date  . Hypertension   . Multinodular goiter   . Positional vertigo   . Pericardial effusion 2008  . History of fibrocystic disease of breast   . Osteopenia   . 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 Thyroid nodule     right  . Borderline diabetes 06/30/2011    pt does not check cbg at home, pt md checks cbg at office  . Arthritis     right knee     Past Surgical History  Procedure Laterality Date  . Hemicolectomy  yrs ago  . Total abdominal hysterectomy w/ bilateral salpingoophorectomy  1984  . Knee arthroscopy  12/20/11    right knee--Dr Blackmon    Family History  Problem Relation Age of Onset  . Hyperlipidemia    . Hypertension    . Esophageal cancer    . Hypertension Mother   . Cancer Father     prostate   Social History:  reports that she has never smoked. She has never used  smokeless tobacco. She reports that she does not drink alcohol or use illicit drugs.  Allergies:  Allergies  Allergen Reactions  . Tetanus Toxoid Rash    Medications Prior to Admission  Medication Sig Dispense Refill  . amLODipine (NORVASC) 5 MG tablet Take 5 mg by mouth every morning.      Marland Kitchen aspirin EC 81 MG tablet Take 81 mg by mouth daily.      Marland Kitchen triamcinolone cream (KENALOG) 0.5 % Apply 1 application topically as needed. For hand rash  30 g  0  . polyethylene glycol (MIRALAX / GLYCOLAX) packet Take 17 g by mouth as needed.      . vitamin C (ASCORBIC ACID) 500 MG tablet Take 500 mg by mouth daily.        No results found for this or any previous visit (from the past 48 hour(s)). No results found.  Review of Systems  Constitutional: Negative.   HENT: Negative.   Eyes: Negative.   Respiratory: Negative.   Cardiovascular: Negative.   Gastrointestinal: Positive for constipation.  Musculoskeletal: Negative.   Skin: Negative.   Neurological: Negative.   Endo/Heme/Allergies: Negative.   Psychiatric/Behavioral: Negative.     Blood pressure 145/70, pulse 70, temperature 98 F (36.7 C), temperature source Oral, resp. rate 18, SpO2 100.00%. Physical Exam  Constitutional: She is oriented to person, place, and time. She appears well-developed and well-nourished. No  distress.  HENT:  Head: Normocephalic and atraumatic.  Right Ear: External ear normal.  Left Ear: External ear normal.  Eyes: Conjunctivae are normal. Pupils are equal, round, and reactive to light. No scleral icterus.  Neck: Normal range of motion. Neck supple. No tracheal deviation present. Thyromegaly present.  Cardiovascular: Normal rate, regular rhythm and normal heart sounds.   Respiratory: Effort normal and breath sounds normal. No respiratory distress.  GI: Soft. Bowel sounds are normal. She exhibits no distension.  Musculoskeletal: Normal range of motion. She exhibits no edema.  Lymphadenopathy:    She has  no cervical adenopathy.  Neurological: She is alert and oriented to person, place, and time.  Skin: Skin is warm and dry.  Psychiatric: She has a normal mood and affect. Her behavior is normal.     Assessment/Plan Multinodular goiter with enlargement of dominant nodule  Plan total thyroidectomy  The risks and benefits of the procedure have been discussed at length with the patient.  The patient understands the proposed procedure, potential alternative treatments, and the course of recovery to be expected.  All of the patient's questions have been answered at this time.  The patient wishes to proceed with surgery.  Earnstine Regal, MD, [facility] Office: [place] 12/15/2013, 11:26 AM

## 2013-12-15 NOTE — Anesthesia Preprocedure Evaluation (Signed)
Anesthesia Evaluation  Patient identified by MRN, date of birth, ID band Patient awake    Reviewed: Allergy & Precautions, H&P , NPO status , Patient's Chart, lab work & pertinent test results  Airway Mallampati: II TM Distance: >3 FB Neck ROM: full    Dental  (+) Edentulous Upper, Edentulous Lower   Pulmonary shortness of breath and with exertion,  breath sounds clear to auscultation  Pulmonary exam normal       Cardiovascular Exercise Tolerance: Good hypertension, Pt. on medications Rhythm:regular Rate:Normal  Pericardial effusion 2008.  Atypical CP.  Occasional palpitations.   Neuro/Psych Positional vertigo negative neurological ROS  negative psych ROS   GI/Hepatic negative GI ROS, Neg liver ROS,   Endo/Other  negative endocrine ROSBorderline diabetes  Renal/GU negative Renal ROS  negative genitourinary   Musculoskeletal   Abdominal   Peds  Hematology negative hematology ROS (+)   Anesthesia Other Findings   Reproductive/Obstetrics negative OB ROS                           Anesthesia Physical Anesthesia Plan  ASA: III  Anesthesia Plan: General   Post-op Pain Management:    Induction: Intravenous  Airway Management Planned: Oral ETT  Additional Equipment:   Intra-op Plan:   Post-operative Plan: Extubation in OR  Informed Consent: I have reviewed the patients History and Physical, chart, labs and discussed the procedure including the risks, benefits and alternatives for the proposed anesthesia with the patient or authorized representative who has indicated his/her understanding and acceptance.   Dental Advisory Given  Plan Discussed with: CRNA and Surgeon  Anesthesia Plan Comments:         Anesthesia Quick Evaluation

## 2013-12-15 NOTE — Brief Op Note (Signed)
12/15/2013  2:00 PM  PATIENT:  Alison [name]  80 y.o. female  PRE-OPERATIVE DIAGNOSIS:  bilateral thyroid nodules  POST-OPERATIVE DIAGNOSIS:  bilateral thyroid nodules  PROCEDURE:  Procedure(s): TOTAL THYROIDECTOMY (N/A)  SURGEON:  Surgeon(s) and Role:    * Earnstine Regal, MD - Primary  PHYSICIAN ASSISTANT:  Barrington Ellison, PA-S  EBL:  Total I/O In: 1000 [I.V.:1000] Out: -   BLOOD ADMINISTERED:none  DRAINS: none   LOCAL MEDICATIONS USED:  NONE  SPECIMEN:  Excision  DISPOSITION OF SPECIMEN:  PATHOLOGY  COUNTS:  YES  TOURNIQUET:  * No tourniquets in log *  DICTATION: .Other Dictation: Dictation Number [id number]  PLAN OF CARE: Admit for overnight observation  PATIENT DISPOSITION:  PACU - hemodynamically stable.   Delay start of Pharmacological VTE agent (>24hrs) due to surgical blood loss or risk of bleeding: yes  Earnstine Regal, MD, [facility] Office: [phone]

## 2013-12-15 NOTE — Anesthesia Postprocedure Evaluation (Signed)
  Anesthesia Post-op Note  Patient: Alison [name]  Procedure(s) Performed: Procedure(s) (LRB): TOTAL THYROIDECTOMY (N/A)  Patient Location: PACU  Anesthesia Type: General  Level of Consciousness: awake and alert   Airway and Oxygen Therapy: Patient Spontanous Breathing  Post-op Pain: mild  Post-op Assessment: Post-op Vital signs reviewed, Patient's Cardiovascular Status Stable, Respiratory Function Stable, Patent Airway and No signs of Nausea or vomiting  Last Vitals:  Filed Vitals:   12/15/13 1500  BP: 126/59  Pulse: 71  Temp:   Resp: 17    Post-op Vital Signs: stable   Complications: No apparent anesthesia complications

## 2013-12-16 ENCOUNTER — Encounter (HOSPITAL_COMMUNITY): Payer: Self-pay | Admitting: Surgery

## 2013-12-16 ENCOUNTER — Telehealth (INDEPENDENT_AMBULATORY_CARE_PROVIDER_SITE_OTHER): Payer: Self-pay

## 2013-12-16 ENCOUNTER — Other Ambulatory Visit (INDEPENDENT_AMBULATORY_CARE_PROVIDER_SITE_OTHER): Payer: Self-pay

## 2013-12-16 ENCOUNTER — Telehealth (INDEPENDENT_AMBULATORY_CARE_PROVIDER_SITE_OTHER): Payer: Self-pay | Admitting: *Deleted

## 2013-12-16 DIAGNOSIS — E039 Hypothyroidism, unspecified: Secondary | ICD-10-CM

## 2013-12-16 LAB — BASIC METABOLIC PANEL
BUN: 12 mg/dL (ref 6–23)
CHLORIDE: 97 meq/L (ref 96–112)
CO2: 24 meq/L (ref 19–32)
CREATININE: 0.79 mg/dL (ref 0.50–1.10)
Calcium: 10.2 mg/dL (ref 8.4–10.5)
GFR calc Af Amer: >90 mL/min (ref >90)
GFR calc non Af Amer: 78 mL/min — ABNORMAL LOW (ref >90)
GLUCOSE: 148 mg/dL — AB (ref 70–99)
POTASSIUM: 4 meq/L (ref 3.7–5.3)
SODIUM: 136 meq/L — AB (ref 137–147)

## 2013-12-16 MED ORDER — HYDROCODONE-ACETAMINOPHEN 5-325 MG PO TABS: 1.0000 | ORAL_TABLET | ORAL | Status: DC | PRN

## 2013-12-16 MED ORDER — SYNTHROID 88 MCG PO TABS: 88.0000 ug | ORAL_TABLET | Freq: Every day | ORAL | Status: DC

## 2013-12-16 MED ORDER — LEVOTHYROXINE SODIUM 88 MCG PO TABS: 88.0000 ug | ORAL_TABLET | Freq: Every day | ORAL | Status: DC

## 2013-12-16 MED ORDER — CALCIUM CARBONATE 1250 (500 CA) MG PO TABS: 2.0000 | ORAL_TABLET | Freq: Two times a day (BID) | ORAL | Status: DC

## 2013-12-16 NOTE — Telephone Encounter (Signed)
Pt called stating that she went to pick up rx for norco, stated it was $30 for #20 pills.  She really doesn't want to pay  that price for that amt of pills.  She is requesting for [place] to talk to Dr. Harlow Asa and see if there is anything that may be cheaper..  Pt # [phone].Thanks!  Anderson Malta

## 2013-12-16 NOTE — Op Note (Signed)
Alison, [name] NO.:  [id number]  MEDICAL RECORD NO.:  [id number]  LOCATION:  1                         FACILITY:  [facility]  PHYSICIAN:  Earnstine Regal, MD      DATE OF BIRTH:  January 06, 1934  DATE OF PROCEDURE:  12/15/2013                               OPERATIVE REPORT   PREOPERATIVE DIAGNOSIS:  Multinodular thyroid with interval enlargement.  POSTOPERATIVE DIAGNOSIS:  Multinodular thyroid with interval enlargement.  PROCEDURE:  Total thyroidectomy.  SURGEON:  Earnstine Regal, MD, FACS  ANESTHESIA:  General.  ESTIMATED BLOOD LOSS:  Minimal.  PREPARATION:  ChloraPrep.  COMPLICATIONS:  None.  INDICATIONS:  The patient is a 80 year old female with bilateral thyroid nodules.  There is a dominant nodule in the inferior right thyroid lobe, for which, she has undergone previous fine-needle aspiration biopsy. This showed a follicular neoplasm.  On sequential ultrasound scanning, there has been interval enlargement of the dominant nodule.  She now comes to Surgery for resection for definitive diagnosis.  BODY OF REPORT:  Procedure was done in OR #11 at the [facility].  The patient was brought to the operating room and placed in supine position on the operating room table.  Following administration of general anesthesia, the patient was positioned and then prepped and draped in usual aseptic fashion.  After ascertaining that, an adequate level of anesthesia had been achieved, a Kocher incision was made with a #15 blade.  Dissection was carried through the subcutaneous tissues and platysma.  Hemostasis was achieved with the electrocautery.  Skin flaps were developed cephalad and caudad from the thyroid notch to the sternal notch.  A Mahorner self-retaining retractor was placed for exposure.  Strap muscles were incised in the midline. Dissection was carried down to the thyroid gland.  Strap muscles were then reflected laterally exposing the  left thyroid lobe.  Left lobe was relatively normal in size.  There were palpable small nodules within the thyroid parenchyma.  Gland was gently mobilized with blunt dissection. Venous tributaries were divided between Ligaclips with the Harmonic scalpel.  Superior pole vessels were dissected out individually and divided between small and medium Ligaclips with the Harmonic scalpel. Gland was rolled anteriorly.  Superior parathyroid gland was identified and preserved.  Branches of the inferior thyroid artery were divided between small Ligaclips.  Inferior venous tributaries were divided between Ligaclips with the Harmonic scalpel.  Recurrent laryngeal nerve was identified and preserved.  Ligament of Gwenlyn Found was released with the electrocautery and the gland was mobilized onto the anterior trachea. Isthmus was mobilized across the midline.  There was no significant pyramidal lobe present.  Dry pack was placed in the left neck.  Next, we turned our attention to the right thyroid lobe.  Again, strap muscles were reflected laterally.  Middle thyroid vein was divided between Ligaclips with the Harmonic scalpel.  Further dissection reveals a large nodule measuring at least 3 cm in size extending beneath the right clavicle.  This was almost completely separate from the right lobe of the thyroid gland.  It was gently dissected out and mobilized. Venous tributaries were divided between Ligaclips with the Harmonic scalpel.  Nodule was delivered from  the anterior mediastinum up into the neck with gentle retraction.  Parathyroid tissue was identified and preserved.  Branches of the inferior thyroid artery were divided between small Ligaclips.  Superior pole was then dissected out and superior pole vessel was divided between small and medium Ligaclips with the Harmonic scalpel.  Gland was rolled anteriorly.  Superior parathyroid gland was identified and preserved.  Recurrent laryngeal nerve was identified  and preserved.  Remaining branches of the inferior thyroid artery were divided between small Ligaclips and the ligament of Gwenlyn Found was released with the electrocautery and the gland was mobilized onto the anterior trachea, from which, it was completely resected.  A suture was used to mark the midportion of the right thyroid lobe.  The entire specimen was submitted to Pathology for review.  The neck was irrigated with warm saline.  Good hemostasis was achieved throughout.  Fibrillar was placed throughout the operative field.  Strap muscles were reapproximated in the midline with interrupted 3-0 Vicryl sutures.  Platysma was closed with interrupted 3-0 Vicryl sutures.  Skin was closed with a running 4-0 Monocryl subcuticular suture.  Wound was washed and dried, and benzoin and Steri-Strips were applied.  Sterile dressings were applied.  The patient was awakened from anesthesia and brought to the recovery room.  The patient tolerated the procedure well.   Earnstine Regal, MD, [facility] Office: [phone]   TMG/MEDQ  D:  12/15/2013  T:  12/16/2013  Job:  [id number]  cc:   Daniel Nones, MD  Hilliard Clark A. Loanne Drilling, MD  Debbrah Alar, NP Fax: [phone]

## 2013-12-16 NOTE — Progress Notes (Signed)
Quick Note:  Please contact patient and notify of benign pathology results.  Deaisa Merida M. Tytus Strahle, MD, FACS [facility] Office: [phone]   ______ 

## 2013-12-16 NOTE — Discharge Summary (Signed)
Physician Discharge Summary [facility]  Patient ID: Alison [name] MRN: [id number] DOB/AGE: 1933-10-13 80 y.o.  Admit date: 12/15/2013 Discharge date: 12/16/2013  Admission Diagnoses:  Thyroid nodules  Discharge Diagnoses:  Principal Problem:   GOITER, MULTINODULAR Active Problems:   Multiple thyroid nodules   Discharged Condition: good  Hospital Course: Patient was admitted for observation following thyroid surgery.  Post op course was uncomplicated.  Pain was well controlled.  Tolerated diet.  Post op calcium level on morning following surgery was 10.2 mg/dl.  Patient was prepared for discharge home on POD#1.  Consults: None  Significant Diagnostic Studies: labs: calcium  Treatments: surgery: total thyroidectomy  Discharge Exam: Blood pressure 130/69, pulse 65, temperature 97.8 F (36.6 C), temperature source Oral, resp. rate 18, height 5\' 3"  (1.6 m), weight 161 lb 13.1 oz (73.4 kg), SpO2 96.00%. HEENT - clear Neck - wound clear and dry, steristrips in place, minimal STS, voice normal Chest - clear bilaterally Cor - RRR   Disposition: Home with family  Discharge Orders   Future Appointments Provider Department Dept Phone   02/11/2014 11:00 AM Debbrah Alar, NP [place] at  [facility] [phone]   Future Orders Complete By Expires   Apply dressing  As directed    Scheduling Instructions:     Apply light gauze dressing to wound before discharge home today.   Diet - low sodium heart healthy  As directed    Discharge instructions  As directed    Comments:     THYROID & PARATHYROID SURGERY - POST OP INSTRUCTIONS  Always review your discharge instruction sheet from the facility where your surgery was performed.  A prescription for pain medication may be given to you upon discharge.  Take your pain medication as prescribed.  If narcotic pain medicine is not needed, then you may take acetaminophen (Tylenol) or ibuprofen (Advil) as  needed.  Take your usually prescribed medications unless otherwise directed.  If you need a refill on your pain medication, please contact your pharmacy. They will contact our office to request authorization.  Prescriptions will not be processed after 5 pm or on weekends.  Start with a light diet upon arrival home, such as soup and crackers or toast.  Be sure to drink plenty of fluids daily.  Resume your normal diet the day after surgery.  Most patients will experience some swelling and bruising on the chest and neck area.  Ice packs will help.  Swelling and bruising can take several days to resolve.   It is common to experience some constipation if taking pain medication after surgery.  Increasing fluid intake and taking a stool softener will usually help or prevent this problem.  A mild laxative (Milk of Magnesia or Miralax) should be taken according to package directions if there are no bowel movements after 48 hours.  You may remove your bandages 24-48 hours after surgery, and you may shower at that time.  You have steri-strips (small skin tapes) in place directly over the incision.  These strips should be left on the skin for 7-10 days and then removed.  You may resume regular (light) daily activities beginning the next day-such as daily self-care, walking, climbing stairs-gradually increasing activities as tolerated.  You may have sexual intercourse when it is comfortable.  Refrain from any heavy lifting or straining until approved by your doctor.  You may drive when you no longer are taking prescription pain medication, you can comfortably wear a seatbelt, and you  can safely maneuver your car and apply brakes.  You should see your doctor in the office for a follow-up appointment approximately two to three weeks after your surgery.  Make sure that you call for this appointment within a day or two after you arrive home to insure a convenient appointment time.  WHEN TO CALL YOUR DOCTOR: --  Fever greater than 101.5 -- Inability to urinate -- Nausea and/or vomiting - persistent -- Extreme swelling or bruising -- Continued bleeding from incision -- Increased pain, redness, or drainage from the incision -- Difficulty swallowing or breathing -- Muscle cramping or spasms -- Numbness or tingling in hands or around lips  The clinic staff is available to answer your questions during regular business hours.  Please don't hesitate to call and ask to speak to one of the nurses if you have concerns.  Earnstine Regal, MD, [facility] Office: [phone]   Increase activity slowly  As directed    Remove dressing in 24 hours  As directed        Medication List         amLODipine 5 MG tablet  Commonly known as:  NORVASC  Take 5 mg by mouth every morning.     aspirin EC 81 MG tablet  Take 81 mg by mouth daily.     calcium carbonate 1250 MG tablet  Commonly known as:  OS-CAL - dosed in mg of elemental calcium  Take 2 tablets (1,000 mg of elemental calcium total) by mouth 2 (two) times daily with a meal.     HYDROcodone-acetaminophen 5-325 MG per tablet  Commonly known as:  NORCO/VICODIN  Take 1-2 tablets by mouth every 4 (four) hours as needed for moderate pain.     polyethylene glycol packet  Commonly known as:  MIRALAX / GLYCOLAX  Take 17 g by mouth as needed.     SYNTHROID 88 MCG tablet  Generic drug:  levothyroxine  Take 1 tablet (88 mcg total) by mouth daily before breakfast.     triamcinolone cream 0.5 %  Commonly known as:  KENALOG  Apply 1 application topically as needed. For hand rash     vitamin C 500 MG tablet  Commonly known as:  ASCORBIC ACID  Take 500 mg by mouth daily.         Earnstine Regal, MD, [facility] Office: [phone]   Signed: Earnstine Regal 12/16/2013, 8:23 AM

## 2013-12-16 NOTE — Telephone Encounter (Signed)
PO appt made. Lab slip mailed to pt for calcium prior to ov.

## 2013-12-16 NOTE — Telephone Encounter (Signed)
I called and spoke with pt. She is not concerned about her Norco she has concerns about the synthroid. Pt states she is fine taking tylenol for pain. Pt states her thyroid med[synthroid] is 30 dollars. Pt wants to know if we can change it to generic. I advised pt that I will review this with Dr Harlow Asa this afternoon and call pt back.

## 2013-12-16 NOTE — Telephone Encounter (Signed)
Per Dr Gala Lewandowsky order synthroid changed to Levothyroxine 88 mcg #30 one po q day. Pharmacy confirmed change of order via phone. Pt aware.

## 2013-12-17 ENCOUNTER — Telehealth (INDEPENDENT_AMBULATORY_CARE_PROVIDER_SITE_OTHER): Payer: Self-pay

## 2013-12-17 NOTE — Telephone Encounter (Signed)
Pt returned call and was given her path results and the post op appt date and time.

## 2013-12-17 NOTE — Telephone Encounter (Signed)
LM with spouse to have pt call our office. Per Dr Harlow Asa pt can be given path result.

## 2013-12-21 HISTORY — PX: KNEE ARTHROSCOPY: SHX127

## 2014-01-06 LAB — CALCIUM: Calcium: 9.2 mg/dL (ref 8.4–10.5)

## 2014-01-07 ENCOUNTER — Encounter (INDEPENDENT_AMBULATORY_CARE_PROVIDER_SITE_OTHER): Payer: Medicare Other | Admitting: Surgery

## 2014-01-08 ENCOUNTER — Ambulatory Visit (INDEPENDENT_AMBULATORY_CARE_PROVIDER_SITE_OTHER): Payer: Medicare Other | Admitting: Surgery

## 2014-01-08 ENCOUNTER — Encounter (INDEPENDENT_AMBULATORY_CARE_PROVIDER_SITE_OTHER): Payer: Self-pay | Admitting: Surgery

## 2014-01-08 VITALS — BP 128/72 | HR 80 | Temp 98.1°F | Resp 16 | Wt 160.0 lb

## 2014-01-08 DIAGNOSIS — E042 Nontoxic multinodular goiter: Secondary | ICD-10-CM

## 2014-01-08 DIAGNOSIS — E890 Postprocedural hypothyroidism: Secondary | ICD-10-CM

## 2014-01-08 NOTE — Addendum Note (Signed)
Addended by: Dois Davenport on: 01/08/2014 11:32 AM   Modules accepted: Orders

## 2014-01-08 NOTE — Progress Notes (Signed)
General Surgery [facility]  Chief Complaint  Patient presents with  . Routine Post Op    total thyroidectomy 12/15/2013    HISTORY: Patient is a 80 year old female who underwent total thyroidectomy on 12/15/2013. Final pathology shows nodular hyperplasia and multiple thyroid nodules. There was no evidence of malignancy. Postoperative calcium level has remained normal at 9.2 and the patient has discontinued supplemental calcium. She is taking Synthroid 88 mcg daily. We plan to check a TSH level in one to 2 weeks.  EXAM: Surgical wound is healing nicely. Mild soft tissue swelling. No tenderness. No sign of seroma. No sign of infection. Voice quality is normal.  IMPRESSION: Status post total thyroidectomy with benign final pathology  PLAN: Patient is on Synthroid 88 mcg daily. We will check a TSH level in one to 2 weeks. Patient will begin applying topical creams to her incision. She will return for final wound check in 6 weeks.  Earnstine Regal, MD, [facility]   Visit Diagnoses: 1. GOITER, MULTINODULAR   2. Hypothyroidism, postsurgical

## 2014-01-08 NOTE — Patient Instructions (Signed)
  CARE OF INCISION   Apply cocoa butter/vitamin E cream (Palmer's brand) to your incision 2 - 3 times daily.  Massage cream into incision for one minute with each application.  Use sunscreen (50 SPF or higher) for first 6 months after surgery if area is exposed to sun.  You may alternate Mederma or other scar reducing cream with cocoa butter cream if desired.       Nevayah Faust M. Sander Speckman, MD, FACS      [facility]      Office: [phone]    

## 2014-01-22 LAB — TSH: TSH: 0.181 u[IU]/mL — AB (ref 0.350–4.500)

## 2014-01-23 ENCOUNTER — Telehealth (INDEPENDENT_AMBULATORY_CARE_PROVIDER_SITE_OTHER): Payer: Self-pay

## 2014-01-23 DIAGNOSIS — E039 Hypothyroidism, unspecified: Secondary | ICD-10-CM

## 2014-01-23 MED ORDER — LEVOTHYROXINE SODIUM 75 MCG PO TABS: 75.0000 ug | ORAL_TABLET | Freq: Every day | ORAL | Status: DC

## 2014-01-23 NOTE — Telephone Encounter (Signed)
Message copied by Dois Davenport on Fri Jan 23, 2014 10:28 AM ------      Message from: Earnstine Regal      Created: Fri Jan 23, 2014  9:41 AM       TSH level is suppressed.  Need to decrease Synthroid dosage and repeat TSH in 6 weeks after dosage change.            Cindy - please change Synthroid to 75 mcg daily.            Thanks,            tmg            Earnstine Regal, MD, [facility]      Office: [phone]                  ----- Message -----         From: Lab in [place]: 01/22/2014   9:00 PM           To: Earnstine Regal, MD                   ------

## 2014-01-23 NOTE — Telephone Encounter (Signed)
Patient returned call to the office.  Message given to patient regarding her TSH level being suppressed and advised patient that a new prescription for Synthroid 75 mcg #30 has been called into [facility] via [facility].  Patient advised that we will need to recheck her TSH in 6 weeks and a lab slip has been ,ailed to patient.  Patient verbalized understanding.

## 2014-01-23 NOTE — Telephone Encounter (Signed)
LMOM for pt to call back. See attached change of dosage attached. Synthroid 75 mcg #30 sent to [facility] thru [facility]. Lab slip to have TSH repeated in 6 weeks mailed to pt.

## 2014-02-11 ENCOUNTER — Ambulatory Visit: Payer: Medicare Other | Admitting: Family

## 2014-02-27 ENCOUNTER — Other Ambulatory Visit (INDEPENDENT_AMBULATORY_CARE_PROVIDER_SITE_OTHER): Payer: Self-pay | Admitting: Surgery

## 2014-02-28 LAB — TSH: TSH: 0.437 u[IU]/mL (ref 0.350–4.500)

## 2014-03-03 ENCOUNTER — Encounter: Payer: Self-pay | Admitting: Internal Medicine

## 2014-03-03 ENCOUNTER — Encounter (INDEPENDENT_AMBULATORY_CARE_PROVIDER_SITE_OTHER): Payer: Self-pay | Admitting: Surgery

## 2014-03-03 ENCOUNTER — Ambulatory Visit (INDEPENDENT_AMBULATORY_CARE_PROVIDER_SITE_OTHER): Payer: Medicare Other | Admitting: Surgery

## 2014-03-03 VITALS — BP 118/82 | HR 76 | Temp 98.1°F | Resp 14 | Ht 63.0 in | Wt 160.8 lb

## 2014-03-03 DIAGNOSIS — E042 Nontoxic multinodular goiter: Secondary | ICD-10-CM

## 2014-03-03 DIAGNOSIS — E890 Postprocedural hypothyroidism: Secondary | ICD-10-CM

## 2014-03-03 NOTE — Patient Instructions (Signed)
  CARE OF INCISION   Apply cocoa butter/vitamin E cream (Palmer's brand) to your incision 2 - 3 times daily.  Massage cream into incision for one minute with each application.  Use sunscreen (50 SPF or higher) for first 6 months after surgery if area is exposed to sun.  You may alternate Mederma or other scar reducing cream with cocoa butter cream if desired.       Kalinda Romaniello M. Saul Dorsi, MD, FACS      [facility]      Office: [phone]    

## 2014-03-03 NOTE — Progress Notes (Signed)
General Surgery [facility]  Chief Complaint  Patient presents with  . Routine Post Op    total thyroidectomy 12/15/2013    HISTORY: Patient is a 80 year old female who underwent total thyroidectomy in March 2015. Her postoperative dose of Synthroid was adjusted to 75 mcg daily by her primary care provider. TSH level from last week is within the normal range at 0.437. Patient returns today for final wound check.  Patient states that her voice quality is normal. She is having no problems with dysphagia. She denies any pain.  EXAM: Surgical wound is well-healed. No soft tissue swelling. Voice quality is normal. No palpable masses.  IMPRESSION: Status post total thyroidectomy  PLAN: Patient will continue to be seen by her primary care provider. She will remain on Synthroid 75 mcg daily at the present time.  Patient will return for surgical care as needed.  Earnstine Regal, MD, [facility]   Visit Diagnoses: 1. Hypothyroidism, postsurgical   2. GOITER, MULTINODULAR

## 2014-03-06 ENCOUNTER — Ambulatory Visit (INDEPENDENT_AMBULATORY_CARE_PROVIDER_SITE_OTHER): Payer: Medicare Other | Admitting: Family

## 2014-03-06 ENCOUNTER — Encounter: Payer: Self-pay | Admitting: Family

## 2014-03-06 VITALS — BP 114/70 | HR 78 | Temp 98.8°F | Resp 16 | Ht 63.5 in | Wt 160.1 lb

## 2014-03-06 DIAGNOSIS — E890 Postprocedural hypothyroidism: Secondary | ICD-10-CM

## 2014-03-06 DIAGNOSIS — E039 Hypothyroidism, unspecified: Secondary | ICD-10-CM

## 2014-03-06 DIAGNOSIS — R8290 Unspecified abnormal findings in urine: Secondary | ICD-10-CM | POA: Insufficient documentation

## 2014-03-06 DIAGNOSIS — R7309 Other abnormal glucose: Secondary | ICD-10-CM

## 2014-03-06 DIAGNOSIS — E119 Type 2 diabetes mellitus without complications: Secondary | ICD-10-CM

## 2014-03-06 DIAGNOSIS — R7303 Prediabetes: Secondary | ICD-10-CM

## 2014-03-06 DIAGNOSIS — R82998 Other abnormal findings in urine: Secondary | ICD-10-CM

## 2014-03-06 DIAGNOSIS — I10 Essential (primary) hypertension: Secondary | ICD-10-CM

## 2014-03-06 LAB — POCT URINALYSIS DIPSTICK
Bilirubin, UA: NEGATIVE
Blood, UA: NEGATIVE
Glucose, UA: NEGATIVE
KETONES UA: NEGATIVE
Leukocytes, UA: NEGATIVE
Nitrite, UA: NEGATIVE
PROTEIN UA: NEGATIVE
Spec Grav, UA: 1.01
UROBILINOGEN UA: 0.2
pH, UA: 6.5

## 2014-03-06 LAB — HEMOGLOBIN A1C
Hgb A1c MFr Bld: 6.1 % — ABNORMAL HIGH (ref <5.7)
MEAN PLASMA GLUCOSE: 128 mg/dL — AB (ref <117)

## 2014-03-06 LAB — BASIC METABOLIC PANEL WITH GFR
BUN: 15 mg/dL (ref 6–23)
CHLORIDE: 103 meq/L (ref 96–112)
CO2: 27 mEq/L (ref 19–32)
Calcium: 9.4 mg/dL (ref 8.4–10.5)
Creat: 0.86 mg/dL (ref 0.50–1.10)
GFR, EST NON AFRICAN AMERICAN: 65 mL/min
GFR, Est African American: 75 mL/min
Glucose, Bld: 104 mg/dL — ABNORMAL HIGH (ref 70–99)
POTASSIUM: 4.5 meq/L (ref 3.5–5.3)
SODIUM: 139 meq/L (ref 135–145)

## 2014-03-06 MED ORDER — AMLODIPINE BESYLATE 5 MG PO TABS: 5.0000 mg | ORAL_TABLET | Freq: Every morning | ORAL | Status: DC

## 2014-03-06 MED ORDER — LEVOTHYROXINE SODIUM 75 MCG PO TABS: 75.0000 ug | ORAL_TABLET | Freq: Every day | ORAL | Status: DC

## 2014-03-06 NOTE — Progress Notes (Signed)
Pre visit review using our clinic review tool, if applicable. No additional management support is needed unless otherwise documented below in the visit note. 

## 2014-03-06 NOTE — Progress Notes (Signed)
Subjective:    Patient ID: Alison [name], female    DOB: 1934-07-25, 80 y.o.   MRN: [id number]  HPI  Alison [name] is a 80 yr old female who presents today for follow up.  1) HTN- on amlodipine.   BP Readings from Last 3 Encounters:  03/06/14 114/70  03/03/14 118/82  01/08/14 128/72   2) Borderline diabetes- last a1c 6.4.  Diet controlled.    3) urinary Odor- Notes + urinary odor.  Reports nocturia x 3.  Denies fever.  Denies hematuria.   4) Hypothyroid- On synthroid 37mcg. Previously on 61mcg.  Last TSH was WNL.  She follows with Dr. Harlow Asa.    Review of Systems See HPI  Past Medical History  Diagnosis Date  . Hypertension   . Multinodular goiter   . Positional vertigo   . Pericardial effusion 2008  . History of fibrocystic disease of breast   . Osteopenia   . 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 Thyroid nodule     right  . Borderline diabetes 06/30/2011    pt does not check cbg at home, pt md checks cbg at office  . Arthritis     right knee     History   Social History  . Marital Status: Married    Spouse Name: N/A    Number of Children: N/A  . Years of Education: N/A   Occupational History  . Retired     [place] Topics  . Smoking status: Never Smoker   . Smokeless tobacco: Never Used  . Alcohol Use: No  . Drug Use: No  . Sexual Activity: Not on file   Other Topics Concern  . Not on file   Social History Narrative  . No narrative on file    Past Surgical History  Procedure Laterality Date  . Hemicolectomy  yrs ago  . Total abdominal hysterectomy w/ bilateral salpingoophorectomy  1984  . Knee arthroscopy  12/20/11    right knee--Dr Blackmon  . Thyroidectomy N/A 12/15/2013    Procedure: TOTAL THYROIDECTOMY;  Surgeon: Earnstine Regal, MD;  Location: [facility];  Service: General;  Laterality: N/A;    Family History  Problem Relation Age of Onset  . Hyperlipidemia    .  Hypertension    . Esophageal cancer    . Hypertension Mother   . Cancer Father     prostate    Allergies  Allergen Reactions  . Tetanus Toxoid Rash    Current Outpatient Prescriptions on File Prior to Visit  Medication Sig Dispense Refill  . amLODipine (NORVASC) 5 MG tablet Take 5 mg by mouth every morning.      Marland Kitchen aspirin EC 81 MG tablet Take 81 mg by mouth daily.      Marland Kitchen levothyroxine (SYNTHROID) 75 MCG tablet Take 1 tablet (75 mcg total) by mouth daily before breakfast.  30 tablet  2  . polyethylene glycol (MIRALAX / GLYCOLAX) packet Take 17 g by mouth as needed.      . triamcinolone cream (KENALOG) 0.5 % Apply 1 application topically as needed. For hand rash  30 g  0  . vitamin C (ASCORBIC ACID) 500 MG tablet Take 500 mg by mouth daily.      . [DISCONTINUED] Calcium Carbonate-Vitamin D (CALTRATE 600+D) 600-400 MG-UNIT per tablet Take 1 tablet by mouth 2 (two) times daily.       No current facility-administered  medications on file prior to visit.    BP 114/70  Pulse 78  Temp(Src) 98.8 F (37.1 C) (Oral)  Resp 16  Ht 5' 3.5" (1.613 m)  Wt 160 lb 1.9 oz (72.63 kg)  BMI 27.92 kg/m2  SpO2 99%       Objective:   Physical Exam  Constitutional: She is oriented to person, place, and time. She appears well-developed and well-nourished. No distress.  HENT:  Head: Normocephalic and atraumatic.  Cardiovascular: Normal rate and regular rhythm.   No murmur heard. Pulmonary/Chest: Effort normal and breath sounds normal. No respiratory distress. She has no wheezes. She has no rales. She exhibits no tenderness.  Musculoskeletal: She exhibits no edema.  Neurological: She is alert and oriented to person, place, and time.  Psychiatric: She has a normal mood and affect. Her behavior is normal. Judgment and thought content normal.          Assessment & Plan:

## 2014-03-06 NOTE — Assessment & Plan Note (Signed)
Stable on amlodipine, continue same.  

## 2014-03-06 NOTE — Assessment & Plan Note (Signed)
UA unremarkable. Will send urine for culture.

## 2014-03-06 NOTE — Patient Instructions (Addendum)
Please complete your lab work prior to leaving.  Schedule routine eye exam. Follow up in 3 months.

## 2014-03-06 NOTE — Assessment & Plan Note (Signed)
Last A1C stable. Obtain follow up A1C, advised pt to schedule DM eye exam.

## 2014-03-06 NOTE — Assessment & Plan Note (Signed)
TSH stable on current dose of synthroid- managed by surgeon.

## 2014-03-07 LAB — MICROALBUMIN / CREATININE URINE RATIO
CREATININE, URINE: 163.7 mg/dL
MICROALB UR: 0.5 mg/dL (ref 0.00–1.89)
Microalb Creat Ratio: 3.1 mg/g (ref 0.0–30.0)

## 2014-03-08 LAB — URINE CULTURE
COLONY COUNT: NO GROWTH
Organism ID, Bacteria: NO GROWTH

## 2014-03-09 ENCOUNTER — Encounter: Payer: Self-pay | Admitting: Family

## 2014-04-22 ENCOUNTER — Telehealth (INDEPENDENT_AMBULATORY_CARE_PROVIDER_SITE_OTHER): Payer: Self-pay

## 2014-04-22 ENCOUNTER — Other Ambulatory Visit (INDEPENDENT_AMBULATORY_CARE_PROVIDER_SITE_OTHER): Payer: Self-pay

## 2014-04-22 DIAGNOSIS — E039 Hypothyroidism, unspecified: Secondary | ICD-10-CM

## 2014-04-22 MED ORDER — LEVOTHYROXINE SODIUM 75 MCG PO TABS: 75.0000 ug | ORAL_TABLET | Freq: Every day | ORAL | Status: DC

## 2014-04-22 NOTE — Telephone Encounter (Signed)
Refill for Levothyroxine 75 mcg with one additional refill faxed to [facility] per Dr Gala Lewandowsky request. All future refills should come from PCP Marcelo Baldy NP per Dr Harlow Asa. Pt will be notified of this.

## 2014-05-12 ENCOUNTER — Encounter: Payer: Self-pay | Admitting: Family

## 2014-05-12 ENCOUNTER — Ambulatory Visit (INDEPENDENT_AMBULATORY_CARE_PROVIDER_SITE_OTHER): Payer: Medicare Other | Admitting: Family

## 2014-05-12 VITALS — BP 136/80 | HR 76 | Temp 97.9°F | Resp 16 | Ht 63.5 in | Wt 162.0 lb

## 2014-05-12 DIAGNOSIS — Z0000 Encounter for general adult medical examination without abnormal findings: Secondary | ICD-10-CM

## 2014-05-12 DIAGNOSIS — R194 Change in bowel habit: Secondary | ICD-10-CM

## 2014-05-12 DIAGNOSIS — R198 Other specified symptoms and signs involving the digestive system and abdomen: Secondary | ICD-10-CM

## 2014-05-12 DIAGNOSIS — R197 Diarrhea, unspecified: Secondary | ICD-10-CM

## 2014-05-12 DIAGNOSIS — K529 Noninfective gastroenteritis and colitis, unspecified: Secondary | ICD-10-CM

## 2014-05-12 NOTE — Progress Notes (Signed)
Pre visit review using our clinic review tool, if applicable. No additional management support is needed unless otherwise documented below in the visit note. 

## 2014-05-12 NOTE — Patient Instructions (Signed)
Please go to lab and obtain stool specimen cups.  Please return these at your earliest convenience. Eliminate all dairy/milk products for the next 1 week.  Call me and let me know if your stool frequency changes after you stop milk products. You should be contacted about your mammogram.  Please let us know if you have not heard back in 1 week about this appointment.

## 2014-05-12 NOTE — Progress Notes (Signed)
Subjective:    Patient ID: Alison [name], female    DOB: 19-Nov-1933, 80 y.o.   MRN: [id number]  HPI  Alison [name] is a 80 yr old female who presents today to discuss increased frequency of bowel movements.  Reports that her stools are formed and a normal brown color. No black/bloody stools. Denies associated abdominal pain. Reports about 3 bowel movements. Has been going on for several months.  Only using miralax on an as needed basis. She reports that she does eat some dairy products.    Wt Readings from Last 3 Encounters:  05/12/14 162 lb (73.483 kg)  03/06/14 160 lb 1.9 oz (72.63 kg)  03/03/14 160 lb 12.8 oz (72.938 kg)     Review of Systems    see HPI  Past Medical History  Diagnosis Date  . Hypertension   . Multinodular goiter   . Positional vertigo   . Pericardial effusion 2008  . History of fibrocystic disease of breast   . Osteopenia   . 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 Thyroid nodule     right  . Borderline diabetes 06/30/2011    pt does not check cbg at home, pt md checks cbg at office  . Arthritis     right knee     History   Social History  . Marital Status: Married    Spouse Name: N/A    Number of Children: N/A  . Years of Education: N/A   Occupational History  . Retired     [place] Topics  . Smoking status: Never Smoker   . Smokeless tobacco: Never Used  . Alcohol Use: No  . Drug Use: No  . Sexual Activity: Not on file   Other Topics Concern  . Not on file   Social History Narrative  . No narrative on file    Past Surgical History  Procedure Laterality Date  . Hemicolectomy  yrs ago  . Total abdominal hysterectomy w/ bilateral salpingoophorectomy  1984  . Knee arthroscopy  12/20/11    right knee--Dr Blackmon  . Thyroidectomy N/A 12/15/2013    Procedure: TOTAL THYROIDECTOMY;  Surgeon: Earnstine Regal, MD;  Location: [facility];  Service: General;  Laterality: N/A;     Family History  Problem Relation Age of Onset  . Hyperlipidemia    . Hypertension    . Esophageal cancer    . Hypertension Mother   . Cancer Father     prostate    Allergies  Allergen Reactions  . Tetanus Toxoid Rash    Current Outpatient Prescriptions on File Prior to Visit  Medication Sig Dispense Refill  . amLODipine (NORVASC) 5 MG tablet Take 1 tablet (5 mg total) by mouth every morning.  30 tablet  2  . aspirin EC 81 MG tablet Take 81 mg by mouth daily.      Marland Kitchen levothyroxine (SYNTHROID) 75 MCG tablet Take 1 tablet (75 mcg total) by mouth daily before breakfast.  30 tablet  1  . polyethylene glycol (MIRALAX / GLYCOLAX) packet Take 17 g by mouth as needed.      . triamcinolone cream (KENALOG) 0.5 % Apply 1 application topically as needed. For hand rash  30 g  0  . vitamin C (ASCORBIC ACID) 500 MG tablet Take 500 mg by mouth daily.      . [DISCONTINUED] Calcium Carbonate-Vitamin D (CALTRATE 600+D) 600-400 MG-UNIT per tablet Take  1 tablet by mouth 2 (two) times daily.       No current facility-administered medications on file prior to visit.    BP 136/80  Pulse 76  Temp(Src) 97.9 F (36.6 C) (Oral)  Resp 16  Ht 5' 3.5" (1.613 m)  Wt 162 lb (73.483 kg)  BMI 28.24 kg/m2  SpO2 97%    Objective:   Physical Exam  Constitutional: She is oriented to person, place, and time. She appears well-developed and well-nourished. No distress.  HENT:  Head: Normocephalic and atraumatic.  Cardiovascular: Normal rate and regular rhythm.   No murmur heard. Pulmonary/Chest: Effort normal and breath sounds normal. No respiratory distress. She has no wheezes. She has no rales. She exhibits no tenderness.  Abdominal: Soft. Bowel sounds are normal. She exhibits no distension and no mass. There is no tenderness. There is no rebound and no guarding.  Neurological: She is alert and oriented to person, place, and time.  Psychiatric: She has a normal mood and affect. Her behavior is normal.  Judgment and thought content normal.          Assessment & Plan:

## 2014-05-13 DIAGNOSIS — R194 Change in bowel habit: Secondary | ICD-10-CM | POA: Insufficient documentation

## 2014-05-13 NOTE — Assessment & Plan Note (Signed)
Will order stool studies (culture, c diff, ova and parasites)   Eliminate all dairy/milk products for the next 1 week.  Pt to call me and let me know if your stool frequency changes after stopping milk products.

## 2014-05-17 ENCOUNTER — Telehealth: Payer: Self-pay | Admitting: Family

## 2014-05-17 NOTE — Telephone Encounter (Signed)
Please remind pt to complete stool studies.

## 2014-05-18 ENCOUNTER — Other Ambulatory Visit: Payer: Self-pay | Admitting: Family

## 2014-05-19 ENCOUNTER — Telehealth: Payer: Self-pay | Admitting: Family

## 2014-05-19 LAB — OVA AND PARASITE EXAMINATION: OP: NONE SEEN

## 2014-05-19 NOTE — Telephone Encounter (Signed)
Results are avaliable in EPIC, please advise.

## 2014-05-19 NOTE — Telephone Encounter (Signed)
Opened in error

## 2014-05-20 LAB — CLOSTRIDIUM DIFFICILE BY PCR: CDIFFPCR: NOT DETECTED

## 2014-05-22 LAB — STOOL CULTURE

## 2014-06-16 ENCOUNTER — Encounter: Payer: Self-pay | Admitting: Internal Medicine

## 2014-06-23 ENCOUNTER — Telehealth: Payer: Self-pay | Admitting: Family

## 2014-06-23 DIAGNOSIS — E890 Postprocedural hypothyroidism: Secondary | ICD-10-CM

## 2014-06-23 NOTE — Telephone Encounter (Signed)
Caller name: Clarita Relation to pt: self Call back number: [phone] Pharmacy:  Reason for call:   Patient thinks that levothyroxine is too strong for her. She states that she wakes up during the night hungry. She says that she did not take her medication yesterday and did not wake up hungry during the night.

## 2014-06-23 NOTE — Telephone Encounter (Signed)
Notified pt and scheduled lab appt for 06/24/14 at 9:45am. Lab order entered.

## 2014-06-23 NOTE — Telephone Encounter (Signed)
Please advise 

## 2014-06-23 NOTE — Telephone Encounter (Signed)
Lets have her return to lab for TSH please. Dx hypothyroid. If necessary we will adjust.

## 2014-06-24 ENCOUNTER — Other Ambulatory Visit: Payer: Medicare Other

## 2014-06-24 DIAGNOSIS — E890 Postprocedural hypothyroidism: Secondary | ICD-10-CM

## 2014-06-25 LAB — TSH: TSH: 1.43 u[IU]/mL (ref 0.35–4.50)

## 2014-06-26 ENCOUNTER — Encounter: Payer: Self-pay | Admitting: Family

## 2014-07-20 ENCOUNTER — Other Ambulatory Visit: Payer: Self-pay | Admitting: Family

## 2014-07-20 NOTE — Telephone Encounter (Signed)
Rx request to pharmacy/[facility]  

## 2014-09-07 ENCOUNTER — Telehealth: Payer: Self-pay | Admitting: *Deleted

## 2014-09-07 ENCOUNTER — Encounter: Payer: Self-pay | Admitting: Family

## 2014-09-07 ENCOUNTER — Ambulatory Visit (INDEPENDENT_AMBULATORY_CARE_PROVIDER_SITE_OTHER): Payer: Medicare Other | Admitting: Family

## 2014-09-07 VITALS — BP 140/80 | HR 85 | Temp 98.0°F | Resp 16 | Ht 63.5 in | Wt 155.6 lb

## 2014-09-07 DIAGNOSIS — E039 Hypothyroidism, unspecified: Secondary | ICD-10-CM

## 2014-09-07 DIAGNOSIS — E890 Postprocedural hypothyroidism: Secondary | ICD-10-CM

## 2014-09-07 DIAGNOSIS — R634 Abnormal weight loss: Secondary | ICD-10-CM

## 2014-09-07 LAB — TSH: TSH: 2.85 u[IU]/mL (ref 0.35–4.50)

## 2014-09-07 NOTE — Progress Notes (Signed)
Pre visit review using our clinic review tool, if applicable. No additional management support is needed unless otherwise documented below in the visit note. 

## 2014-09-07 NOTE — Patient Instructions (Addendum)
Please complete lab work prior to leaving. Follow up in 1 month.  

## 2014-09-07 NOTE — Progress Notes (Signed)
Subjective:    Patient ID: Alison [name], female    DOB: 07-Mar-1934, 80 y.o.   MRN: [id number]  HPI  Alison [name] is a 80 yr old female who presents today with two concerns:  1) Weight loss- reports + weight loss decreased appetite x 2-3 weeks.  She denies nausea.  Denies abdominal pain. Denies sick contacts or exposure to GI illness. She denies diarrhea. Denies depression.  Her husband is currently undergoing work up for possible recurrent lung cancer and this is causing her worry.   Wt Readings from Last 3 Encounters:  09/07/14 155 lb 9.6 oz (70.58 kg)  05/12/14 162 lb (73.483 kg)  03/06/14 160 lb 1.9 oz (72.63 kg)   2) L ankle swelling- reports that there left ankle swelled last week after shopping.  Notes that this has improved with elevation.    Review of Systems See HPI  Past Medical History  Diagnosis Date  . Hypertension   . Multinodular goiter   . Positional vertigo   . Pericardial effusion 2008  . History of fibrocystic disease of breast   . Osteopenia   . 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 Thyroid nodule     right  . Borderline diabetes 06/30/2011    pt does not check cbg at home, pt md checks cbg at office  . Arthritis     right knee     History   Social History  . Marital Status: Married    Spouse Name: N/A    Number of Children: N/A  . Years of Education: N/A   Occupational History  . Retired     [place] Topics  . Smoking status: Never Smoker   . Smokeless tobacco: Never Used  . Alcohol Use: No  . Drug Use: No  . Sexual Activity: Not on file   Other Topics Concern  . Not on file   Social History Narrative    Past Surgical History  Procedure Laterality Date  . Hemicolectomy  yrs ago  . Total abdominal hysterectomy w/ bilateral salpingoophorectomy  1984  . Knee arthroscopy  12/20/11    right knee--Dr Blackmon  . Thyroidectomy N/A 12/15/2013    Procedure: TOTAL  THYROIDECTOMY;  Surgeon: Earnstine Regal, MD;  Location: [facility];  Service: General;  Laterality: N/A;    Family History  Problem Relation Age of Onset  . Hyperlipidemia    . Hypertension    . Esophageal cancer    . Hypertension Mother   . Cancer Father     prostate    Allergies  Allergen Reactions  . Tetanus Toxoid Rash    Current Outpatient Prescriptions on File Prior to Visit  Medication Sig Dispense Refill  . amLODipine (NORVASC) 5 MG tablet Take 1 tablet (5 mg total) by mouth every morning. 30 tablet 2  . aspirin EC 81 MG tablet Take 81 mg by mouth daily.    . polyethylene glycol (MIRALAX / GLYCOLAX) packet Take 17 g by mouth as needed.    . triamcinolone cream (KENALOG) 0.5 % Apply 1 application topically as needed. For hand rash 30 g 0  . vitamin C (ASCORBIC ACID) 500 MG tablet Take 500 mg by mouth daily.    . [DISCONTINUED] Calcium Carbonate-Vitamin D (CALTRATE 600+D) 600-400 MG-UNIT per tablet Take 1 tablet by mouth 2 (two) times daily.     No current facility-administered medications on file prior to  visit.    BP 140/80 mmHg  Pulse 85  Temp(Src) 98 F (36.7 C) (Oral)  Resp 16  Ht 5' 3.5" (1.613 m)  Wt 155 lb 9.6 oz (70.58 kg)  BMI 27.13 kg/m2  SpO2 99%       Objective:   Physical Exam  Constitutional: She is oriented to person, place, and time. She appears well-developed and well-nourished. No distress.  HENT:  Head: Normocephalic and atraumatic.  Cardiovascular: Normal rate and regular rhythm.   No murmur heard. Pulmonary/Chest: Effort normal and breath sounds normal. No respiratory distress. She has no wheezes. She has no rales. She exhibits no tenderness.  Musculoskeletal:  No significant ankle swelling is noted.   Neurological: She is alert and oriented to person, place, and time.  Psychiatric: She has a normal mood and affect. Her behavior is normal. Judgment and thought content normal.          Assessment & Plan:

## 2014-09-07 NOTE — Telephone Encounter (Signed)
-----   Message from Lanelle Bal, [place] sent at 09/07/2014 11:56 AM EST ----- Regarding: [employer] quality project Good afternoon Gilmore Laroche, hope that your holiday was nice. I need a favor, I am working on the [employer] quality project trying to close gaps so that report can be submitted back to [facility]. This pt is being seen today and I noticed that he office note is still open. With that said, do you think that Melissa could address physical activity which will satisfy [employer] req. Thanks

## 2014-09-08 ENCOUNTER — Encounter: Payer: Self-pay | Admitting: Family

## 2014-09-09 NOTE — Telephone Encounter (Signed)
Could you please let me know what needs to be included specifically?  thanks

## 2014-09-14 ENCOUNTER — Other Ambulatory Visit: Payer: Self-pay | Admitting: Family

## 2014-09-15 ENCOUNTER — Telehealth: Payer: Self-pay | Admitting: Family

## 2014-09-15 DIAGNOSIS — R634 Abnormal weight loss: Secondary | ICD-10-CM | POA: Insufficient documentation

## 2014-09-15 NOTE — Telephone Encounter (Signed)
Please contact pt and arrange a 1 month follow up appointment so we can check on her weight and appetite concerns.

## 2014-09-15 NOTE — Assessment & Plan Note (Signed)
Follow up TSH is normal.

## 2014-09-15 NOTE — Assessment & Plan Note (Addendum)
TSH is normal.  Most likely cause is stress.  If symptoms worsen or if no improvement will need further work up to include a work up for possible underlying malignancy. Advised pt to let us know if symptoms worsen of if symptoms do not improve over the next few weeks.

## 2014-09-17 NOTE — Telephone Encounter (Signed)
Could you schedule this patient please

## 2014-09-17 NOTE — Telephone Encounter (Signed)
Phone keeps ringing busy. Will try again later.

## 2014-09-21 NOTE — Telephone Encounter (Signed)
Appointment scheduled for 10/14/14.

## 2014-10-14 ENCOUNTER — Telehealth: Payer: Self-pay | Admitting: Family

## 2014-10-14 ENCOUNTER — Encounter: Payer: Self-pay | Admitting: Family

## 2014-10-14 ENCOUNTER — Ambulatory Visit (INDEPENDENT_AMBULATORY_CARE_PROVIDER_SITE_OTHER): Payer: Medicare Other | Admitting: Family

## 2014-10-14 VITALS — BP 140/82 | HR 85 | Temp 97.9°F | Resp 16 | Ht 63.5 in | Wt 152.0 lb

## 2014-10-14 DIAGNOSIS — R198 Other specified symptoms and signs involving the digestive system and abdomen: Secondary | ICD-10-CM

## 2014-10-14 DIAGNOSIS — R634 Abnormal weight loss: Secondary | ICD-10-CM | POA: Diagnosis not present

## 2014-10-14 LAB — BASIC METABOLIC PANEL
BUN: 13 mg/dL (ref 6–23)
CHLORIDE: 104 meq/L (ref 96–112)
CO2: 28 meq/L (ref 19–32)
CREATININE: 1.3 mg/dL — AB (ref 0.4–1.2)
Calcium: 9.3 mg/dL (ref 8.4–10.5)
GFR: 49.53 mL/min — AB (ref >60.00)
Glucose, Bld: 125 mg/dL — ABNORMAL HIGH (ref 70–99)
Potassium: 3.5 mEq/L (ref 3.5–5.1)
SODIUM: 137 meq/L (ref 135–145)

## 2014-10-14 NOTE — Telephone Encounter (Signed)
Kidney function has worsened since last check.  Is she using NSAIDS? If so she should discontinue.  Kidney function can be rechecked at her next visit.

## 2014-10-14 NOTE — Telephone Encounter (Signed)
Notified pt. She states doctor told her to take ibuprofen for arthritis in her hip but she has been off of it x 2 weeks. Advised pt to remain off of it going forward and to have labs rechecked when she establishes care with Dr Doug Sou. Pt voices understanding.

## 2014-10-14 NOTE — Progress Notes (Signed)
Pre visit review using our clinic review tool, if applicable. No additional management support is needed unless otherwise documented below in the visit note. 

## 2014-10-14 NOTE — Assessment & Plan Note (Signed)
Pt still having decreased appetite and early satiety. Will obtain BMET to assess kidney function and then abdominal CT. Will follow up with patient after CT results are reviewed.

## 2014-10-14 NOTE — Patient Instructions (Addendum)
Please complete lab work prior to leaving. You will be contacted about your CT scan- You will be contacted about your results.

## 2014-10-14 NOTE — Progress Notes (Signed)
Subjective:    Patient ID: Alison [name], female    DOB: 1934-05-05, 81 y.o.   MRN: [id number]  HPI Alison [name] is here for follow up for lack of appetite and weight loss which started in early November. TSH was found to be normal. Has an appetite but does not want to eat as much. Denies stomach pain. Reports more frequent bowel movements than she used to have. Denies diarrhea. She wonders if her new dentures could have something to do with lack of appetite because they have an odd taste. Wt Readings from Last 3 Encounters:  10/14/14 152 lb (68.947 kg)  09/07/14 155 lb 9.6 oz (70.58 kg)  05/12/14 162 lb (73.483 kg)   Left ankle swelling- she reported ankle swelling in early November after a shopping trip. Ankle is no longer swollen and she reports no pain.  Review of Systems  Constitutional: Positive for unexpected weight change. Negative for fever, chills and fatigue.  Respiratory: Negative for cough and shortness of breath.   Cardiovascular: Negative for chest pain.  Gastrointestinal: Negative for nausea, vomiting, abdominal pain, diarrhea, constipation, blood in stool and abdominal distention.  Psychiatric/Behavioral:       Denies anxiety/depression.   Past Medical History  Diagnosis Date  . Hypertension   . Multinodular goiter   . Positional vertigo   . Pericardial effusion 2008  . History of fibrocystic disease of breast   . Osteopenia   . 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 Thyroid nodule     right  . Borderline diabetes 06/30/2011    pt does not check cbg at home, pt md checks cbg at office  . Arthritis     right knee     History   Social History  . Marital Status: Married    Spouse Name: N/A    Number of Children: N/A  . Years of Education: N/A   Occupational History  . Retired     [place] Topics  . Smoking status: Never Smoker   . Smokeless tobacco: Never Used  . Alcohol Use: No  .  Drug Use: No  . Sexual Activity: Not on file   Other Topics Concern  . Not on file   Social History Narrative    Past Surgical History  Procedure Laterality Date  . Hemicolectomy  yrs ago  . Total abdominal hysterectomy w/ bilateral salpingoophorectomy  1984  . Knee arthroscopy  12/20/11    right knee--Dr Blackmon  . Thyroidectomy N/A 12/15/2013    Procedure: TOTAL THYROIDECTOMY;  Surgeon: Earnstine Regal, MD;  Location: [facility];  Service: General;  Laterality: N/A;    Family History  Problem Relation Age of Onset  . Hyperlipidemia    . Hypertension    . Esophageal cancer    . Hypertension Mother   . Cancer Father     prostate    Allergies  Allergen Reactions  . Tetanus Toxoid Rash    Current Outpatient Prescriptions on File Prior to Visit  Medication Sig Dispense Refill  . amLODipine (NORVASC) 5 MG tablet Take 1 tablet (5 mg total) by mouth every morning. 30 tablet 2  . aspirin EC 81 MG tablet Take 81 mg by mouth daily.    Marland Kitchen levothyroxine (SYNTHROID, LEVOTHROID) 75 MCG tablet TAKE 1 TABLET (75 MCG TOTAL) BY MOUTH DAILY BEFORE BREAKFAST. 30 tablet 1  . polyethylene glycol (MIRALAX / GLYCOLAX) packet Take 17  g by mouth as needed.    . triamcinolone cream (KENALOG) 0.5 % Apply 1 application topically as needed. For hand rash 30 g 0  . vitamin C (ASCORBIC ACID) 500 MG tablet Take 500 mg by mouth daily.    . [DISCONTINUED] Calcium Carbonate-Vitamin D (CALTRATE 600+D) 600-400 MG-UNIT per tablet Take 1 tablet by mouth 2 (two) times daily.     No current facility-administered medications on file prior to visit.    BP 140/82 mmHg  Pulse 85  Temp(Src) 97.9 F (36.6 C) (Oral)  Resp 16  Ht 5' 3.5" (1.613 m)  Wt 152 lb (68.947 kg)  BMI 26.50 kg/m2  SpO2 99%      Objective:   Physical Exam  Constitutional: She is oriented to person, place, and time. She appears well-developed and well-nourished. No distress.  HENT:  Head: Normocephalic and atraumatic.  Eyes: No scleral  icterus.  Neck: Normal range of motion. Neck supple. No tracheal deviation present.  Cardiovascular: Normal rate, regular rhythm and normal heart sounds.  Exam reveals no gallop and no friction rub.   No murmur heard. Pulmonary/Chest: Effort normal and breath sounds normal. No respiratory distress. She has no wheezes. She has no rales.  Abdominal: Soft. She exhibits no distension. There is no tenderness. There is no rebound and no guarding.  Some fullness to palpation noted LUQ  Lymphadenopathy:    She has no cervical adenopathy.       Right axillary: No pectoral and no lateral adenopathy present.       Left axillary: No pectoral and no lateral adenopathy present. Neurological: She is alert and oriented to person, place, and time.  Skin: Skin is warm and dry. She is not diaphoretic.  Psychiatric: She has a normal mood and affect. Her behavior is normal. Thought content normal.          Assessment & Plan:  Patient seen along with Dawn Whitmire NP-student.  I have personally seen and examined patient and agree with Ms. Whitmire's assessment and plan.

## 2014-10-16 ENCOUNTER — Telehealth: Payer: Self-pay | Admitting: Family

## 2014-10-16 ENCOUNTER — Ambulatory Visit (HOSPITAL_BASED_OUTPATIENT_CLINIC_OR_DEPARTMENT_OTHER)
Admission: RE | Admit: 2014-10-16 | Discharge: 2014-10-16 | Disposition: A | Discharge status: Home / Self Care | Payer: Medicare Other | Source: Ambulatory Visit | Attending: Family | Admitting: Family

## 2014-10-16 DIAGNOSIS — N1330 Unspecified hydronephrosis: Secondary | ICD-10-CM | POA: Diagnosis not present

## 2014-10-16 DIAGNOSIS — R634 Abnormal weight loss: Secondary | ICD-10-CM | POA: Diagnosis present

## 2014-10-16 DIAGNOSIS — R599 Enlarged lymph nodes, unspecified: Secondary | ICD-10-CM | POA: Diagnosis not present

## 2014-10-16 DIAGNOSIS — R198 Other specified symptoms and signs involving the digestive system and abdomen: Secondary | ICD-10-CM

## 2014-10-16 MED ORDER — IOHEXOL 300 MG/ML  SOLN
100.0000 mL | Freq: Once | INTRAMUSCULAR | Status: AC | PRN
  Administered 2014-10-16: 100 mL via INTRAVENOUS

## 2014-10-16 NOTE — Telephone Encounter (Signed)
Spoke with Dr. Ronny Bacon, interventional radiology.  He reviewed films.  He did not think that the lymph nodes would be amenable to percutaneous biopsy.  Noted that the lymph nodes are ill defined. He agreed with urology referral as a starting point, then depending on the findings to consider a PET scan at a later date to further evaluate.  I spoke with pt. She is scheduled to see urology on Tuesday and I explained findings to her. She verbalizes understanding. She will be establishing with Dr. Doug Sou at our [place] office next month as it is closer to her home.

## 2014-10-20 DIAGNOSIS — N1330 Unspecified hydronephrosis: Secondary | ICD-10-CM | POA: Diagnosis not present

## 2014-10-21 ENCOUNTER — Other Ambulatory Visit: Payer: Self-pay | Admitting: Family

## 2014-10-26 DIAGNOSIS — N1330 Unspecified hydronephrosis: Secondary | ICD-10-CM | POA: Diagnosis not present

## 2014-10-26 DIAGNOSIS — N390 Urinary tract infection, site not specified: Secondary | ICD-10-CM | POA: Diagnosis not present

## 2014-11-03 ENCOUNTER — Other Ambulatory Visit: Payer: Self-pay | Admitting: Urology

## 2014-11-05 ENCOUNTER — Encounter (HOSPITAL_BASED_OUTPATIENT_CLINIC_OR_DEPARTMENT_OTHER): Payer: Self-pay | Admitting: *Deleted

## 2014-11-05 NOTE — Progress Notes (Signed)
NPO AFTER MN. ARRIVE AT 0930. NEEDS ISTAT AND EKG. WILL TAKE NORVASC AND SYNTHROID AM DOS W/ SIPS OF WATER.

## 2014-11-08 NOTE — Anesthesia Preprocedure Evaluation (Addendum)
Anesthesia Evaluation  Patient identified by MRN, date of birth, ID band Patient awake    Reviewed: Allergy & Precautions, NPO status , Patient's Chart, lab work & pertinent test results  History of Anesthesia Complications Negative for: history of anesthetic complications  Airway Mallampati: II  TM Distance: >3 FB Neck ROM: Full    Dental no notable dental hx. (+) Dental Advisory Given, Edentulous Lower, Edentulous Upper   Pulmonary shortness of breath and with exertion,  breath sounds clear to auscultation  Pulmonary exam normal       Cardiovascular hypertension, Pt. on medications and Pt. on home beta blockers Rhythm:Regular Rate:Normal     Neuro/Psych negative neurological ROS  negative psych ROS   GI/Hepatic Neg liver ROS, GERD-  Medicated and Controlled,  Endo/Other  diabetesHypothyroidism   Renal/GU Renal disease  negative genitourinary   Musculoskeletal  (+) Arthritis -, Osteoarthritis,    Abdominal   Peds negative pediatric ROS (+)  Hematology negative hematology ROS (+)   Anesthesia Other Findings   Reproductive/Obstetrics negative OB ROS                            Anesthesia Physical Anesthesia Plan  ASA: II  Anesthesia Plan: General   Post-op Pain Management:    Induction: Intravenous  Airway Management Planned: LMA  Additional Equipment:   Intra-op Plan:   Post-operative Plan: Extubation in OR  Informed Consent: I have reviewed the patients History and Physical, chart, labs and discussed the procedure including the risks, benefits and alternatives for the proposed anesthesia with the patient or authorized representative who has indicated his/her understanding and acceptance.   Dental advisory given  Plan Discussed with: CRNA  Anesthesia Plan Comments:         Anesthesia Quick Evaluation

## 2014-11-10 ENCOUNTER — Encounter (HOSPITAL_BASED_OUTPATIENT_CLINIC_OR_DEPARTMENT_OTHER): Payer: Self-pay | Admitting: Certified Registered"

## 2014-11-10 ENCOUNTER — Ambulatory Visit (HOSPITAL_BASED_OUTPATIENT_CLINIC_OR_DEPARTMENT_OTHER)
Admission: RE | Admit: 2014-11-10 | Discharge: 2014-11-10 | Disposition: A | Discharge status: Home / Self Care | Payer: Medicare Other | Source: Ambulatory Visit | Attending: Urology | Admitting: Urology

## 2014-11-10 ENCOUNTER — Encounter (HOSPITAL_BASED_OUTPATIENT_CLINIC_OR_DEPARTMENT_OTHER)
Admission: RE | Disposition: A | Discharge status: Home / Self Care | Payer: Self-pay | Source: Ambulatory Visit | Attending: Urology

## 2014-11-10 ENCOUNTER — Ambulatory Visit (HOSPITAL_BASED_OUTPATIENT_CLINIC_OR_DEPARTMENT_OTHER): Payer: Medicare Other | Admitting: Anesthesiology

## 2014-11-10 ENCOUNTER — Other Ambulatory Visit: Payer: Self-pay

## 2014-11-10 DIAGNOSIS — Z881 Allergy status to other antibiotic agents status: Secondary | ICD-10-CM | POA: Diagnosis not present

## 2014-11-10 DIAGNOSIS — E785 Hyperlipidemia, unspecified: Secondary | ICD-10-CM | POA: Diagnosis not present

## 2014-11-10 DIAGNOSIS — N1330 Unspecified hydronephrosis: Secondary | ICD-10-CM | POA: Diagnosis not present

## 2014-11-10 DIAGNOSIS — M1990 Unspecified osteoarthritis, unspecified site: Secondary | ICD-10-CM | POA: Diagnosis not present

## 2014-11-10 DIAGNOSIS — I10 Essential (primary) hypertension: Secondary | ICD-10-CM | POA: Diagnosis not present

## 2014-11-10 DIAGNOSIS — E119 Type 2 diabetes mellitus without complications: Secondary | ICD-10-CM | POA: Insufficient documentation

## 2014-11-10 DIAGNOSIS — K219 Gastro-esophageal reflux disease without esophagitis: Secondary | ICD-10-CM | POA: Diagnosis not present

## 2014-11-10 DIAGNOSIS — E039 Hypothyroidism, unspecified: Secondary | ICD-10-CM | POA: Diagnosis not present

## 2014-11-10 HISTORY — DX: Personal history of adenomatous and serrated colon polyps: Z86.0101

## 2014-11-10 HISTORY — DX: Ventricular premature depolarization: I49.3

## 2014-11-10 HISTORY — DX: Gastro-esophageal reflux disease without esophagitis: K21.9

## 2014-11-10 HISTORY — DX: Unspecified hydronephrosis: N13.30

## 2014-11-10 HISTORY — PX: CYSTOSCOPY WITH RETROGRADE PYELOGRAM, URETEROSCOPY AND STENT PLACEMENT: SHX5789

## 2014-11-10 HISTORY — DX: Type 2 diabetes mellitus without complications: E11.9

## 2014-11-10 HISTORY — DX: Personal history of colonic polyps: Z86.010

## 2014-11-10 HISTORY — DX: Presence of dental prosthetic device (complete) (partial): Z97.2

## 2014-11-10 HISTORY — DX: Postprocedural hypothyroidism: E89.0

## 2014-11-10 HISTORY — DX: Disease of pericardium, unspecified: I31.9

## 2014-11-10 HISTORY — DX: Nocturia: R35.1

## 2014-11-10 HISTORY — DX: Benign paroxysmal vertigo, unspecified ear: H81.10

## 2014-11-10 HISTORY — DX: Complete loss of teeth, unspecified cause, unspecified class: K08.109

## 2014-11-10 LAB — POCT I-STAT 4, (NA,K, GLUC, HGB,HCT)
GLUCOSE: 115 mg/dL — AB (ref 70–99)
HCT: 45 % (ref 36.0–46.0)
HEMOGLOBIN: 15.3 g/dL — AB (ref 12.0–15.0)
Potassium: 3.8 mmol/L (ref 3.5–5.1)
Sodium: 142 mmol/L (ref 135–145)

## 2014-11-10 SURGERY — CYSTOURETEROSCOPY, WITH RETROGRADE PYELOGRAM AND STENT INSERTION
Anesthesia: General | Site: Ureter | Laterality: Right

## 2014-11-10 MED ORDER — PHENAZOPYRIDINE HCL 100 MG PO TABS
ORAL_TABLET | ORAL | Status: AC
  Filled 2014-11-10: qty 2

## 2014-11-10 MED ORDER — PHENAZOPYRIDINE HCL 200 MG PO TABS
200.0000 mg | ORAL_TABLET | Freq: Once | ORAL | Status: AC
  Administered 2014-11-10: 200 mg via ORAL
  Filled 2014-11-10: qty 1

## 2014-11-10 MED ORDER — CIPROFLOXACIN IN D5W 400 MG/200ML IV SOLN
INTRAVENOUS | Status: AC
  Filled 2014-11-10: qty 200

## 2014-11-10 MED ORDER — IOHEXOL 350 MG/ML SOLN
INTRAVENOUS | Status: DC | PRN
  Administered 2014-11-10: 42 mL

## 2014-11-10 MED ORDER — PROPOFOL 10 MG/ML IV BOLUS
INTRAVENOUS | Status: DC | PRN
  Administered 2014-11-10: 120 mg via INTRAVENOUS

## 2014-11-10 MED ORDER — FENTANYL CITRATE 0.05 MG/ML IJ SOLN
25.0000 ug | INTRAMUSCULAR | Status: DC | PRN
  Filled 2014-11-10: qty 1

## 2014-11-10 MED ORDER — FENTANYL CITRATE 0.05 MG/ML IJ SOLN
INTRAMUSCULAR | Status: DC | PRN
  Administered 2014-11-10 (×2): 25 ug via INTRAVENOUS

## 2014-11-10 MED ORDER — FENTANYL CITRATE 0.05 MG/ML IJ SOLN
INTRAMUSCULAR | Status: AC
  Filled 2014-11-10: qty 2

## 2014-11-10 MED ORDER — PHENAZOPYRIDINE HCL 200 MG PO TABS: 200.0000 mg | ORAL_TABLET | Freq: Three times a day (TID) | ORAL | Status: DC | PRN

## 2014-11-10 MED ORDER — LACTATED RINGERS IV SOLN
INTRAVENOUS | Status: DC
  Administered 2014-11-10: 10:00:00 via INTRAVENOUS
  Filled 2014-11-10: qty 1000

## 2014-11-10 MED ORDER — SODIUM CHLORIDE 0.9 % IR SOLN
Status: DC | PRN
  Administered 2014-11-10: 4500 mL

## 2014-11-10 MED ORDER — CIPROFLOXACIN IN D5W 400 MG/200ML IV SOLN
400.0000 mg | INTRAVENOUS | Status: AC
  Administered 2014-11-10: 400 mg via INTRAVENOUS
  Filled 2014-11-10: qty 200

## 2014-11-10 MED ORDER — ONDANSETRON HCL 4 MG/2ML IJ SOLN
4.0000 mg | Freq: Once | INTRAMUSCULAR | Status: DC | PRN
  Filled 2014-11-10: qty 2

## 2014-11-10 MED ORDER — DEXAMETHASONE SODIUM PHOSPHATE 4 MG/ML IJ SOLN
INTRAMUSCULAR | Status: DC | PRN
  Administered 2014-11-10: 4 mg via INTRAVENOUS

## 2014-11-10 MED ORDER — LIDOCAINE HCL (CARDIAC) 20 MG/ML IV SOLN
INTRAVENOUS | Status: DC | PRN
  Administered 2014-11-10: 50 mg via INTRAVENOUS

## 2014-11-10 MED ORDER — ONDANSETRON HCL 4 MG/2ML IJ SOLN
INTRAMUSCULAR | Status: DC | PRN
  Administered 2014-11-10: 4 mg via INTRAVENOUS

## 2014-11-10 SURGICAL SUPPLY — 24 items
BAG DRAIN URO-CYSTO SKYTR STRL (DRAIN) ×3 IMPLANT
BAG DRN UROCATH (DRAIN) ×1
BRUSH URET BIOPSY 3F (UROLOGICAL SUPPLIES) ×2 IMPLANT
CANISTER SUCT LVC 12 LTR MEDI- (MISCELLANEOUS) ×3 IMPLANT
CATH URET 5FR 28IN OPEN ENDED (CATHETERS) ×3 IMPLANT
CATH URET DUAL LUMEN 6-10FR 50 (CATHETERS) ×2 IMPLANT
CLOTH BEACON ORANGE TIMEOUT ST (SAFETY) ×3 IMPLANT
CONT SPECI 4OZ STER CLIK (MISCELLANEOUS) ×2 IMPLANT
CONTOUR STENT 4.8 X 24 ×2 IMPLANT
DRAPE CAMERA CLOSED 9X96 (DRAPES) ×3 IMPLANT
GLOVE BIO SURGEON STRL SZ7.5 (GLOVE) ×3 IMPLANT
GLOVE SURG SS PI 7.5 STRL IVOR (GLOVE) ×4 IMPLANT
GOWN STRL REUS W/ TWL XL LVL3 (GOWN DISPOSABLE) IMPLANT
GOWN STRL REUS W/TWL XL LVL3 (GOWN DISPOSABLE) ×6
GUIDEWIRE ANG ZIPWIRE 038X150 (WIRE) ×2 IMPLANT
GUIDEWIRE STR DUAL SENSOR (WIRE) ×3 IMPLANT
IV NS 1000ML (IV SOLUTION) ×3
IV NS 1000ML BAXH (IV SOLUTION) IMPLANT
IV NS IRRIG 3000ML ARTHROMATIC (IV SOLUTION) ×4 IMPLANT
NS IRRIG 500ML POUR BTL (IV SOLUTION) ×2 IMPLANT
PACK CYSTO (CUSTOM PROCEDURE TRAY) ×3 IMPLANT
STENT PERCUFLEX 4.8FRX24 (STENTS) ×2 IMPLANT
STENT URET 6FRX24 CONTOUR (STENTS) IMPLANT
SYRINGE 10CC LL (SYRINGE) ×2 IMPLANT

## 2014-11-10 NOTE — Transfer of Care (Signed)
Immediate Anesthesia Transfer of Care Note  Patient: Alison [name]  Procedure(s) Performed: Procedure(s) (LRB): CYSTO/BILATERAL RETROGRADE PYELOGRAM/RIGHT  URETEROSCOPY WITH BRUSH BIOPSY/RIGHT URETERAL STENT PLACEMENT (Right)  Patient Location: PACU  Anesthesia Type: General  Level of Consciousness: awake, oriented, sedated and patient cooperative  Airway & Oxygen Therapy: Patient Spontanous Breathing and Patient connected to face mask oxygen  Post-op Assessment: Report given to PACU RN and Post -op Vital signs reviewed and stable  Post vital signs: Reviewed and stable  Complications: No apparent anesthesia complications

## 2014-11-10 NOTE — Anesthesia Postprocedure Evaluation (Signed)
  Anesthesia Post-op Note  Patient: Concha Pyo  Procedure(s) Performed: Procedure(s) (LRB): CYSTO/BILATERAL RETROGRADE PYELOGRAM/RIGHT  URETEROSCOPY WITH BRUSH BIOPSY/RIGHT URETERAL STENT PLACEMENT (Right)  Patient Location: PACU  Anesthesia Type: General  Level of Consciousness: awake and alert   Airway and Oxygen Therapy: Patient Spontanous Breathing  Post-op Pain: mild  Post-op Assessment: Post-op Vital signs reviewed, Patient's Cardiovascular Status Stable, Respiratory Function Stable, Patent Airway and No signs of Nausea or vomiting  Last Vitals:  Filed Vitals:   11/10/14 1245  BP: 129/64  Pulse: 78  Temp:   Resp: 21    Post-op Vital Signs: stable   Complications: No apparent anesthesia complications

## 2014-11-10 NOTE — H&P (Signed)
Reason For Visit Alison [name]   History of Present Illness 29F who represents with incidental finding of Alison hydronephrosis. This was discovered as part of a work-up for unintentional weight loss. She was also noted to have elevated creatinine, 1.3 up from 0.8 eight months prior. The patient denies any progressive voiding symptoms including frequency urgency or dysuria. She denies any gross hematuria. She denies any flank pain. She has no history of kidney stones. She denies any GI symptoms including constipation or diarrhea. She does have a diminished appetite. She denies any lymphadenopathy or easy bruising. She denies any night sweats or fatigue.    PSH - partial colectomy in 1974 for polyp, total hysterectomy in 1984, total thyroidectomy 2015     Intv: we discussed cysto/RPG with ureteroscopy, ureteral biopsy and stent placement at last visit. She was reluctant to proceed without her daughter. She presents today with her daughter. Patient denies any Alison-sided flank or abdominal pain. Her appetite is improved. She denies any GI distress..   Past Medical History Problems  1. History of hyperlipidemia (Z86.39)  Surgical History Problems  1. History of Colon Surgery 2. History of Hysterectomy 3. History of Knee Surgery Alison  Current Meds 1. AmLODIPine Besylate 5 MG Oral Tablet;  Therapy: (Recorded:18Jun2015) to Recorded 2. Levothyroxine Sodium 75 MCG Oral Tablet;  Therapy: (Recorded:18Jun2015) to Recorded  Allergies Medication  1. Tetanus Toxoids  Family History Problems  1. Family history of prostate cancer (Z80.42) : Father, Brother  Social History Problems  1. Denied: History of Alcohol use 2. Denied: History of Caffeine use 3. Married 4. Never a smoker 5. 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 No acute distress.  ENT:. The ears and nose are normal in appearance.  Pulmonary: No respiratory distress, normal respiratory rhythm and effort and clear bilateral breath sounds.  Cardiovascular: Heart rate and rhythm are normal . The arterial pulses are normal. No peripheral edema.  Abdomen: The abdomen is soft and nontender. No masses are palpated. No CVA tenderness. No hernias are palpable. No hepatosplenomegaly noted.  Skin: Normal skin turgor, no visible rash and no visible skin lesions.  Neuro/Psych:. Mood and affect are appropriate.    Results/Data Urine [Data Includes: Last 1 Day]   [id number]  COLOR YELLOW   APPEARANCE CLEAR   SPECIFIC GRAVITY 1.015   pH 6.5   GLUCOSE NEG mg/dL  BILIRUBIN NEG   KETONE NEG mg/dL  BLOOD TRACE   PROTEIN NEG mg/dL  UROBILINOGEN 0.2 mg/dL  NITRITE NEG   LEUKOCYTE ESTERASE NEG   SQUAMOUS EPITHELIAL/HPF MANY   WBC 0-2 WBC/hpf  RBC 0-2 RBC/hpf  BACTERIA RARE   CRYSTALS NONE SEEN   CASTS NONE SEEN    I have independently reviewed the CT scan from 10/16/14 (w/wo contrast, no delayed images) showing delayed uptake of contrast in Alison kidney with severe hydro nephrosis and dilated ureter to pelvic brim. There are no obvious soft tissue lesions causing extrinsic compression but there are enlarged pelvic nodes along the Alison iliac vessels. The left kidney is normal. Bladder is unremarkable.   Patient has microscopic hematuria.   Assessment Assessed  1. Hydronephrosis, Alison (N13.30)  Plan Health Maintenance  1. UA  With REFLEX; [Do Not Release]; Status:Complete;   Done: [id number] 10:44AM Hydronephrosis, Alison, Urinary tract infection  2. URINE CULTURE; Status:Hold For - Specimen/Data Collection,Appointment; Requested  for:18Jan2016;   Discussion/Summary I discussed the findings on the CT scan with the  patient, her daughter, her husband, and her knees. We discussed the implications of hydronephrosis with dilation of the ureter and no other findings. I told her that we needed to work this up further with cystoscopy, retrograde pyelogram, Alison ureteroscopy and possible biopsy. She understands that she would require ureteral stent as well. I went over the risks and benefits of this operation in detail with the patient and her family. We will get this scheduled as soon as we can get it on the schedule and is convenient for the patient.

## 2014-11-10 NOTE — Anesthesia Procedure Notes (Signed)
Procedure Name: LMA Insertion Date/Time: 11/10/2014 11:05 AM Performed by: Denna Haggard D Pre-anesthesia Checklist: Patient identified, Emergency Drugs available, Suction available and Patient being monitored Patient Re-evaluated:Patient Re-evaluated prior to inductionOxygen Delivery Method: Circle System Utilized Preoxygenation: Pre-oxygenation with 100% oxygen Intubation Type: IV induction Ventilation: Mask ventilation without difficulty LMA: LMA inserted LMA Size: 4.0 Number of attempts: 1 Airway Equipment and Method: Bite block Placement Confirmation: positive ETCO2 Tube secured with: Tape Dental Injury: Teeth and Oropharynx as per pre-operative assessment

## 2014-11-10 NOTE — Op Note (Signed)
Preoperative diagnosis:  1. Right asymptomatic hydronephro-ureterosis    Postoperative diagnosis:  1. same   Procedure: 1. Cystoscopy, bilateral retrograde pyelograms with interpretation 2. Right ureteroscopy, brush biopsy 3. Right ureteral stent placement  Surgeon: Ardis Hughs, MD  Anesthesia: General  Complications: None  Intraoperative findings: Patient had a cystocele making identification of her ureteral orifices difficult. The bladder was noted to have normal mucosa without evidence of any tumor or abnormal pathology. I did create a false passage attempting to perform the left retrograde pyelogram. This was corrected using a rigid ureteroscope and the left retrograde pyelogram demonstrated a normal caliber ureter with sharp demarcation of the calyces and no filling defects appreciated. There was prompt excretion of contrast and so no stent was placed for the distal false passage. The right retrograde pyelogram demonstrated a abrupt transition point in the mid ureter at the pelvic inlet with proximal hydro-nephroureterolithiasis and a torturous ureter. The right renal calyces were blunted.  EBL: Minimal  Specimens: Right renal pelvis cytology. Right ureteral wall brush biopsy.  Indication: Alison [name] is a 81 y.o. patient with incidentally found right hydroureteronephrosis as part of a workup for weight loss.  After reviewing the management options for treatment, he elected to proceed with the above surgical procedure(s). We have discussed the potential benefits and risks of the procedure, side effects of the proposed treatment, the likelihood of the patient achieving the goals of the procedure, and any potential problems that might occur during the procedure or recuperation. Informed consent has been obtained.  Description of procedure:  The patient was taken to the operating room and general anesthesia was induced.  The patient was placed in the dorsal lithotomy  position, prepped and draped in the usual sterile fashion, and preoperative antibiotics were administered. A preoperative time-out was performed.   I then gently inserted the 12.5 rigid cystoscope into the patient's urethra and into the bladder under visual guidance. I exchanged this for 7 lens and performed a 360 cystoscopic evaluation with the above findings. I then exchanged again to the 12.5 lens and performed a left-sided retrograde pyelogram with the above findings. Initially, I did have difficulty gaining access to the left ureteral orifice and used a curved Glidewire to cannulate the orifice. I then slowly advance the 5 [place] open-ended ureteral catheter over the wire but did meet some stiff resistance. When injecting contrast was noted to pull and did not advance and the expected trajectory of the ureter. As such, I backed out the wire and the [facility] catheter. After completing the right-sided ureteroscopy and brush biopsy I came back to the left side with a rigid ureteroscope and cannulated the left ureteral orifice and noted a false passage within the intramural bladder. I advanced the scope beyond the false passage into the distal ureter and injected contrast performing the retrograde pyelogram as described above. There was brisk emptying of the collecting system and as such decision was made not to leave the stent because of the false passage. I then turned my attention to the right side and cannulated the right ureteral orifice with the curved Glidewire. Once into the distal ureter advance the 5 [place] open-ended ureteral catheter over it and performed retrograde  With the above findings. I then advanced the wire again through the catheter and with some resistance beyond the strictured segment. I then was able to get the 5 French catheter beyond the stricture and injected more contrast to obtain an upper tract image. This was noted 2  include aturous ureter with blunted calyces. I then advanced  the Pollack catheter into the renal pelvis and performed a washing for cytology. I then removed the Pollack catheter and the rigid cystoscope and used the dual lumen catheter to advance a second wire into the renal pelvis.I then performed rigid ureteroscopy and was able to gain access to the right ureter by directing the tip of the scope between the 2 wires. Once up to the strictured segment I did remove one of the wires to create more room. At this point it was clear that I could not advance beyond this area and as such performed a brush biopsy. I then attempted to take several biopsies of the ureteral wall but was unsuccessful in grasping tissue using our biopsy forceps. As such, I backed out the ureteroscope and opted to place a stent this point. Initially, I attempted to place a 6 French 24 cm stent in the routine fashion through the cystoscope but was unable to advance the stent beyond the stricture without significant amount of force. At this point the stent was not well-positioned and as such I advanced the wire through the stent again and removed the stent over the wire. I then repassed the cystoscope over the wire and opted for a 4.8 French 24 cm double-J ureteral stent. This was easily asked beyond the strictured segment. It was noted to be curled well in the renal pelvis under fluoroscopic guidance as well as in the bladder. The stent tether was removed.  The bladder was then emptied, the patient was subacutely awoken and returned the PACU in excellent condition.   Arcelia Jew, M.D.

## 2014-11-10 NOTE — Discharge Instructions (Signed)
DISCHARGE INSTRUCTIONS FOR KIDNEY STONE/URETERAL STENT   MEDICATIONS:  1.  Resume all your other meds from home - except do not take any extra narcotic pain meds that you may have at home.  2. Pyridium is to help with the burning/stinging when you urinate. 3.  Tylenol will help treat your pain.     ACTIVITY:  1. No strenuous activity x 1week  2. No driving while on narcotic pain medications  3. Drink plenty of water  4. Continue to walk at home - you can still get blood clots when you are at home, so keep active, but don't over do it.  5. May return to work/school tomorrow or when you feel ready   BATHING:  1. You can shower and we recommend daily showers    SIGNS/SYMPTOMS TO CALL:  Please call us if you have a fever greater than 101.5, uncontrolled nausea/vomiting, uncontrolled pain, dizziness, unable to urinate, bloody urine, chest pain, shortness of breath, leg swelling, leg pain, redness around wound, drainage from wound, or any other concerns or questions.   You can reach [place] at [phone].   FOLLOW-UP:  As scheduled      Post Anesthesia Home Care Instructions  Activity: Get plenty of rest for the remainder of the day. A responsible adult should stay with you for 24 hours following the procedure.  For the next 24 hours, DO NOT: -Drive a car -Paediatric nurse -Drink alcoholic beverages -Take any medication unless instructed by your physician -Make any legal decisions or sign important papers.  Meals: Start with liquid foods such as gelatin or soup. Progress to regular foods as tolerated. Avoid greasy, spicy, heavy foods. If nausea and/or vomiting occur, drink only clear liquids until the nausea and/or vomiting subsides. Call your physician if vomiting continues.  Special Instructions/Symptoms: Your throat may feel dry or sore from the anesthesia or the breathing tube placed in your throat during surgery. If this causes discomfort, gargle with warm salt water. The  discomfort should disappear within 24 hours.

## 2014-11-10 NOTE — Interval H&P Note (Signed)
History and Physical Interval Note:  11/10/2014 11:01 AM  Alison [name]  has presented today for surgery, with the diagnosis of RIGHT HYDRONEPHROSIS  The various methods of treatment have been discussed with the patient and family. After consideration of risks, benefits and other options for treatment, the patient has consented to  Procedure(s): CYSTO/BILATERAL RETROGRADE PYELOGRAM/RIGHT  URETEROSCOPY/BIOPSY/RIGHT URETERAL STENT PLACEMENT (Right) as a surgical intervention .  The patient's history has been reviewed, patient examined, no change in status, stable for surgery.  I have reviewed the patient's chart and labs.  Questions were answered to the patient's satisfaction.     Louis Meckel W

## 2014-11-11 ENCOUNTER — Encounter (HOSPITAL_BASED_OUTPATIENT_CLINIC_OR_DEPARTMENT_OTHER): Payer: Self-pay | Admitting: Urology

## 2014-11-12 ENCOUNTER — Telehealth: Payer: Self-pay | Admitting: Family

## 2014-11-12 DIAGNOSIS — R1900 Intra-abdominal and pelvic swelling, mass and lump, unspecified site: Secondary | ICD-10-CM

## 2014-11-12 NOTE — Telephone Encounter (Signed)
Spoke with patient and reviewed recommendations. Patient agreeable with plan. Order signed and sent to [facility]

## 2014-11-12 NOTE — Telephone Encounter (Signed)
Left message with Dr. Carlton Adam nurse to discuss patient case. He will return my call when he is out of procedure.

## 2014-11-12 NOTE — Telephone Encounter (Signed)
Spoke with Dr. Louis Meckel- pt's urologist re: pt case.  His feeling is that the ureteral stenosis is extrinsic.  Cytology washing from cystoscopy is benign.  Dr. Louis Meckel is planning to present case to Tumor board the second Friday of this Month.  In the meantime, I will try to obtain PET scan prior to this meeting.  Dr. Louis Meckel is in agreement with plan.  Gay- could you please contact pt and let her know that I spoke with Dr. Louis Meckel and we would like her to complete a PET scan to take a closer look at her kidney and abdomen.  I have pended the PET scan below. Please sign after you speak with pt.

## 2014-11-17 ENCOUNTER — Other Ambulatory Visit: Payer: Self-pay | Admitting: Family

## 2014-11-18 ENCOUNTER — Telehealth: Payer: Self-pay | Admitting: Family

## 2014-11-18 NOTE — Telephone Encounter (Signed)
I would like this completed tomorrow if possible. Please check status of insurance approval.

## 2014-11-18 NOTE — Telephone Encounter (Signed)
Insurance has yet to approve, you may do a peer to peer review by calling [phone] case Id #[id number]

## 2014-11-19 NOTE — Telephone Encounter (Signed)
Do you have approval number?

## 2014-11-19 NOTE — Telephone Encounter (Signed)
They can not do exam until 11/27/14, drugs have to be ordered for this procedure

## 2014-11-19 NOTE — Telephone Encounter (Signed)
Ok

## 2014-11-19 NOTE — Telephone Encounter (Signed)
Spoke with patient and this is approved. I would really like this scheduled today if possible please.

## 2014-11-21 ENCOUNTER — Encounter (HOSPITAL_COMMUNITY)
Admission: RE | Admit: 2014-11-21 | Discharge: 2014-11-21 | Disposition: A | Discharge status: Home / Self Care | Payer: Medicare Other | Source: Ambulatory Visit | Attending: Family | Admitting: Family

## 2014-11-21 DIAGNOSIS — R1900 Intra-abdominal and pelvic swelling, mass and lump, unspecified site: Secondary | ICD-10-CM | POA: Diagnosis not present

## 2014-11-21 DIAGNOSIS — C772 Secondary and unspecified malignant neoplasm of intra-abdominal lymph nodes: Secondary | ICD-10-CM | POA: Diagnosis not present

## 2014-11-21 DIAGNOSIS — C481 Malignant neoplasm of specified parts of peritoneum: Secondary | ICD-10-CM | POA: Diagnosis not present

## 2014-11-21 LAB — GLUCOSE, CAPILLARY: Glucose-Capillary: 105 mg/dL — ABNORMAL HIGH (ref 70–99)

## 2014-11-21 MED ORDER — FLUDEOXYGLUCOSE F - 18 (FDG) INJECTION
7.4700 | Freq: Once | INTRAVENOUS | Status: AC | PRN
  Administered 2014-11-21: 7.47 via INTRAVENOUS

## 2014-11-23 ENCOUNTER — Telehealth: Payer: Self-pay | Admitting: Family

## 2014-11-23 NOTE — Telephone Encounter (Signed)
Spoke with Dr. Leonia Reeves.  He thinks that best way to obtain tissue sample is through endoscopic biopsy.  I will arrange with GI.   Alison [name] please contact pt and bring her in tomorrow to review Pet scan results.  OK to bring her in at 1PM if necessary.

## 2014-11-23 NOTE — Telephone Encounter (Signed)
Caller name:Dr. [facility]  Can be reached:[phone]   Reason for call: Dr. Leonia Reeves calling requesting to speak with you regarding recent report on Saturday.

## 2014-11-23 NOTE — Telephone Encounter (Signed)
Attempted to reach pt and line is busy.

## 2014-11-23 NOTE — Telephone Encounter (Signed)
Notified pt and she is agreeable to proceed with GI referral.  Scheduled f/u for tomorrow with PCP at 1pm.

## 2014-11-24 ENCOUNTER — Encounter: Payer: Self-pay | Admitting: Family

## 2014-11-24 ENCOUNTER — Telehealth: Payer: Self-pay

## 2014-11-24 ENCOUNTER — Ambulatory Visit (INDEPENDENT_AMBULATORY_CARE_PROVIDER_SITE_OTHER): Payer: Medicare Other | Admitting: Family

## 2014-11-24 VITALS — BP 120/80 | HR 100 | Temp 98.1°F | Resp 16 | Ht 63.5 in | Wt 149.6 lb

## 2014-11-24 DIAGNOSIS — K8689 Other specified diseases of pancreas: Secondary | ICD-10-CM

## 2014-11-24 DIAGNOSIS — N135 Crossing vessel and stricture of ureter without hydronephrosis: Secondary | ICD-10-CM | POA: Diagnosis not present

## 2014-11-24 DIAGNOSIS — C259 Malignant neoplasm of pancreas, unspecified: Secondary | ICD-10-CM | POA: Insufficient documentation

## 2014-11-24 DIAGNOSIS — K869 Disease of pancreas, unspecified: Secondary | ICD-10-CM | POA: Diagnosis not present

## 2014-11-24 DIAGNOSIS — N289 Disorder of kidney and ureter, unspecified: Secondary | ICD-10-CM | POA: Diagnosis not present

## 2014-11-24 LAB — BASIC METABOLIC PANEL
BUN: 16 mg/dL (ref 6–23)
CALCIUM: 9.5 mg/dL (ref 8.4–10.5)
CHLORIDE: 104 meq/L (ref 96–112)
CO2: 29 meq/L (ref 19–32)
Creatinine, Ser: 1.14 mg/dL (ref 0.40–1.20)
GFR: 59.16 mL/min — ABNORMAL LOW (ref >60.00)
Glucose, Bld: 148 mg/dL — ABNORMAL HIGH (ref 70–99)
POTASSIUM: 3.6 meq/L (ref 3.5–5.1)
SODIUM: 138 meq/L (ref 135–145)

## 2014-11-24 NOTE — Telephone Encounter (Signed)
-----   Message from Milus Banister, MD sent at 11/23/2014  1:34 PM EST ----- Melissa, EUS is great next step.  We'll get in touch with her about the procedure. Will likely be next Thursday (25th).  FYI: the mass was clearly visible on the 10/2013 CT scan, not described by radiology.  I'm going to CC one of the abdominal imagers so that they can add an addendum to the CT report  Marylyn Ishihara;  Check out her CT and eventual PET scan.  The pancreatic mass is pretty clear on the CT but was not mentioned.  Can you sent this on to whomever needs to know from your end. An addendum is probably appropriate. I don't know Dr. Posey Pronto, is he an [place] add on?  Averie Hornbaker, She needs upper EUS, radial +/- linear, MAC +, for pancreatic mass, next Thursday, 2/25.  Thanks    Thanks  ----- Message -----    From: Debbrah Alar, NP    Sent: 11/23/2014  12:17 PM      To: Milus Banister, MD  Dan,  Could you please see CT and Pet, if you are agreeable, I would like to set her up to see you for endoscopic biopsy please.  Thanks,  [employer]

## 2014-11-24 NOTE — Assessment & Plan Note (Addendum)
She reports that she voided out the ureteral stent.  Has upcoming follow up with urology- she is instructed to keep this appointment.  Stent was visible on PET.  Obtain follow up bmet to ensure renal function stable. Case was reviewed with Dr. Louis Meckel who feels that it is unlikely that she voided out the stent.  He is aware of the new finding of pancreatic mass.

## 2014-11-24 NOTE — Progress Notes (Signed)
Pre visit review using our clinic review tool, if applicable. No additional management support is needed unless otherwise documented below in the visit note. 

## 2014-11-24 NOTE — Assessment & Plan Note (Signed)
Findings very concerning for metastatic pancreatic cancer. I have reviewed the case with Dr. Ardis Hughs who will schedule her for endoscopic ultrasound/biopsy of pancreatic mass.  Once we have confirmed diagnosis following biopsy,  She will need referral to oncology.  30 minutes spent with pt and family today. (PET results were shared with patient and family.)>50% of this time was spent counseling pt on PET scan findings, upcoming work up.  Support was provided to patient and family.

## 2014-11-24 NOTE — Patient Instructions (Signed)
Please complete your lab work prior to leaving. You will be contacted about your appointment with Dr. Ardis Hughs for your endoscopic ultrasound.   Keep upcoming appointment with Dr. Doug Sou and Dr. Louis Meckel.

## 2014-11-24 NOTE — Progress Notes (Signed)
Subjective:    Patient ID: Alison [name], female    DOB: September 21, 1934, 81 y.o.   MRN: [id number]  HPI  Patient here to discuss PET scan results. She is accompanied today by her husband and niece.   She initially presented 09/07/14- with complaint of anorexia and weight loss.  TSH was normal at that time, and pt reported a significant amount of stress.  She was seen back in the office on 10/12/14 and was noted to have progressive weight loss and ongoing anorexia. This prompted CT abd/pelvis, she was noted to have some LUQ fullness on exam.  Initial CT results noted normal pancreas, but noted severe right hydronephrosis, and RLQ mesenteric lymph nodes and vague soft tissue density more medial within the mesentary. She was referred to Urology (Dr. Louis Meckel) who was able to successfully stent her right ureter.  Cytologic brushings showed reactive cells only.    She was then scheduled for PET scan.  PET scan which was performed this past weekend unfortunately noted:  "Hypermetabolic masses towards the pancreatic tail consists with primary pancreatic adenocarcinoma. The pancreatic masses is immediately adjacent to stomach and would be amenable to ultrasound guided endoscopic biopsy. Recommend gastroenterology consultation for biopsy. 2. Local nodal metastasis along the body of the stomach. 3. Metastatic peritoneal disease with serosal implant along the small bowel in the right lower quadrant. 4. Irregular peritoneal /mesenteric metastasis within the central mesentery and along the course of the right ureter and most certainly etiology of the ureteral obstruction. 5. Interval placement of a right ureteral stent with relief of the hydronephrosis."  Upon further review of the CT abd/pelvis performed in January with Dr. Suzy Bouchard, it was felt that the mass was in fact visible on the CT. An addendum was made to that CT detailing " Hypodense 2.6 x 2.4 cm pancreatic tail mass adjacent to the stomach most  concerning for pancreatic adenocarcinoma. The mass appears to encase a small segment of the mid splenic artery best seen on image 33/series 5 and image 74/series 6. The mesenteric soft tissue nodules likely reflect metastatic disease.  She sees him on 2/22 (Dr. Louis Meckel)   Wt Readings from Last 3 Encounters:  11/24/14 149 lb 9.6 oz (67.858 kg)  11/10/14 151 lb (68.493 kg)  10/14/14 152 lb (68.947 kg)     Past Medical History  Diagnosis Date  . Hypertension   . History of fibrocystic disease of breast   . Osteopenia   . Type 2 diabetes, diet controlled   . Hypothyroidism, postsurgical     S/P  TOTAL THYROIDECTOMY DUE TO RIGHT MULTINODULAR GOITER--  HYPERPLASIA  . GERD (gastroesophageal reflux disease)   . History of adenomatous polyp of colon     TUBULAR ADENOMA-- HYPERPLASTIC  . Hydronephrosis, right   . Nocturia   . Benign positional vertigo   . Arthritis     right knee   . PVC's (premature ventricular contractions)   . Disease of pericardium     unspecified- per dr Stanford Breed note--  per last echo small pericardial effusion 03-11-2013  . Full dentures     Review of Systems    see HPI  Past Medical History  Diagnosis Date  . Hypertension   . History of fibrocystic disease of breast   . Osteopenia   . Type 2 diabetes, diet controlled   . Hypothyroidism, postsurgical     S/P  TOTAL THYROIDECTOMY DUE TO RIGHT MULTINODULAR GOITER--  HYPERPLASIA  . GERD (gastroesophageal reflux disease)   .  History of adenomatous polyp of colon     TUBULAR ADENOMA-- HYPERPLASTIC  . Hydronephrosis, right   . Nocturia   . Benign positional vertigo   . Arthritis     right knee   . PVC's (premature ventricular contractions)   . Disease of pericardium     unspecified- per dr Stanford Breed note--  per last echo small pericardial effusion 03-11-2013  . Full dentures     History   Social History  . Marital Status: Married    Spouse Name: N/A  . Number of Children: N/A  . Years of  Education: N/A   Occupational History  . Retired     [place] Topics  . Smoking status: Never Smoker   . Smokeless tobacco: Never Used  . Alcohol Use: No  . Drug Use: No  . Sexual Activity: Not on file   Other Topics Concern  . Not on file   Social History Narrative    Past Surgical History  Procedure Laterality Date  . Total abdominal hysterectomy w/ bilateral salpingoophorectomy  1984  . Knee arthroscopy Right 12-21-2013  . Thyroidectomy N/A 12/15/2013    Procedure: TOTAL THYROIDECTOMY;  Surgeon: Earnstine Regal, MD;  Location: [facility];  Service: General;  Laterality: N/A;  . Laparoscopic right hemicolectomy  03-12-2001    TUBULAR ADENOMA POLYP  . Colonoscopy w/ polypectomy  last one 2014  . Transthoracic echocardiogram  03-11-2013   dr Stanford Breed    grade I diastolic dysfunction/  ef 55-60%/   mild MR/  trivial PR/  small pericardial effusion identified  . Cystoscopy with retrograde pyelogram, ureteroscopy and stent placement Right 11/10/2014    Procedure: CYSTO/BILATERAL RETROGRADE PYELOGRAM/RIGHT  URETEROSCOPY WITH BRUSH BIOPSY/RIGHT URETERAL STENT PLACEMENT;  Surgeon: Ardis Hughs, MD;  Location: [facility];  Service: Urology;  Laterality: Right;    Family History  Problem Relation Age of Onset  . Hyperlipidemia    . Hypertension    . Esophageal cancer    . Hypertension Mother   . Cancer Father     prostate    Allergies  Allergen Reactions  . Tetanus Toxoid Rash    Current Outpatient Prescriptions on File Prior to Visit  Medication Sig Dispense Refill  . amLODipine (NORVASC) 5 MG tablet TAKE 1 TABLET BY MOUTH EVERY DAY AFTER BREAKFAST 30 tablet 1  . levothyroxine (SYNTHROID, LEVOTHROID) 75 MCG tablet TAKE 1 TABLET (75 MCG TOTAL) BY MOUTH DAILY BEFORE BREAKFAST. 30 tablet 1  . polyethylene glycol (MIRALAX / GLYCOLAX) packet Take 17 g by mouth as needed.    . triamcinolone cream (KENALOG) 0.5 % Apply 1 application  topically as needed. For hand rash 30 g 0  . vitamin C (ASCORBIC ACID) 500 MG tablet Take 500 mg by mouth daily.    . [DISCONTINUED] Calcium Carbonate-Vitamin D (CALTRATE 600+D) 600-400 MG-UNIT per tablet Take 1 tablet by mouth 2 (two) times daily.     No current facility-administered medications on file prior to visit.    BP 120/80 mmHg  Pulse 100  Temp(Src) 98.1 F (36.7 C) (Oral)  Resp 16  Ht 5' 3.5" (1.613 m)  Wt 149 lb 9.6 oz (67.858 kg)  BMI 26.08 kg/m2  SpO2 99%    Objective:    Physical Exam  Constitutional: She appears well-developed and well-nourished.  Cardiovascular: Normal rate, regular rhythm and normal heart sounds.   No murmur heard. Pulmonary/Chest: Effort normal and breath sounds normal. No respiratory distress. She  has no wheezes.  Abdominal: Soft. There is no tenderness.  Firm non-tender epigastric mass noted.    Psychiatric: She has a normal mood and affect. Her behavior is normal. Judgment and thought content normal.      Lab Results  Component Value Date   WBC 5.2 12/10/2013   HGB 15.3* 11/10/2014   HCT 45.0 11/10/2014   PLT 167 12/10/2013   GLUCOSE 115* 11/10/2014   CHOL 195 08/04/2013   TRIG 201* 08/04/2013   HDL 48 08/04/2013   HDL 48 08/04/2013   LDLCALC 107* 08/04/2013   ALT 13 07/06/2010   AST 18 07/06/2010   NA 142 11/10/2014   K 3.8 11/10/2014   CL 104 10/14/2014   CREATININE 1.3* 10/14/2014   BUN 13 10/14/2014   CO2 28 10/14/2014   TSH 2.85 09/07/2014   HGBA1C 6.1* 03/06/2014   MICROALBUR 0.50 03/06/2014    Nm Pet Image Initial (pi) Skull Base To Thigh  11/23/2014   CLINICAL DATA:  Initial treatment strategy for abdominal mass. Abnormal CT scan.  EXAM: NUCLEAR MEDICINE PET SKULL BASE TO THIGH  TECHNIQUE: 7.5 mCi F-18 FDG was injected intravenously. Full-ring PET imaging was performed from the skull base to thigh after the radiotracer. CT data was obtained and used for attenuation correction and anatomic localization.  FASTING  BLOOD GLUCOSE:  Value: 105 mg/dl  COMPARISON:  CT 10/26/2014  FINDINGS: NECK  No hypermetabolic lymph nodes in the neck.  CHEST  No hypermetabolic mediastinal or hilar nodes. No suspicious pulmonary nodules on the CT scan.  ABDOMEN/PELVIS  There is a hypermetabolic mass within the pancreas at the junction of the body and tail measuring approximately 3.9 x 2.4 cm (image 105, series 4) and with hypermetabolic activity (SUV max 6.7). This masses is immediately adjacent to the stomach. There is a adjacent hypermetabolic lymph node along the body of the stomach measuring 13 mm (image 103).  Hypermetabolic serosal implant along the small bowel in the right lower quadrant measuring 16 mm (Image 129) with SUV max 7.0).  Irregular lymph node/peritoneal metastasis along the course of the right ureter on image 121 with associated metabolic activity. Central mesenteric nodes are also hypermetabolic (image 888)  Interval stenting of right ureter.  SKELETON  No focal hypermetabolic activity to suggest skeletal metastasis.  IMPRESSION: 1. Hypermetabolic masses towards the pancreatic tail consists with primary pancreatic adenocarcinoma. The pancreatic masses is immediately adjacent to stomach and would be amenable to ultrasound guided endoscopic biopsy. Recommend gastroenterology consultation for biopsy. 2. Local nodal metastasis along the body of the stomach. 3. Metastatic peritoneal disease with serosal implant along the small bowel in the right lower quadrant. 4. Irregular peritoneal /mesenteric metastasis within the central mesentery and along the course of the right ureter and most certainly etiology of the ureteral obstruction. 5. Interval placement of a right ureteral stent with relief of the hydronephrosis. These results will be called to the ordering clinician or representative by the Radiologist Assistant, and communication documented in the PACS or zVision Dashboard.   Electronically Signed   By: Suzy Bouchard M.D.    On: 11/23/2014 09:21       Assessment & Plan:  Case and plan is reviewed with Dr. Charlett Blake.

## 2014-11-25 ENCOUNTER — Encounter: Payer: Self-pay | Admitting: Family

## 2014-11-25 ENCOUNTER — Other Ambulatory Visit: Payer: Self-pay

## 2014-11-25 ENCOUNTER — Encounter (HOSPITAL_COMMUNITY): Payer: Self-pay | Admitting: *Deleted

## 2014-11-25 DIAGNOSIS — K8689 Other specified diseases of pancreas: Secondary | ICD-10-CM

## 2014-11-26 NOTE — Telephone Encounter (Signed)
Left message on machine to call back  

## 2014-11-27 ENCOUNTER — Ambulatory Visit (HOSPITAL_COMMUNITY): Payer: Medicare Other

## 2014-11-30 DIAGNOSIS — N390 Urinary tract infection, site not specified: Secondary | ICD-10-CM | POA: Diagnosis not present

## 2014-11-30 DIAGNOSIS — N1330 Unspecified hydronephrosis: Secondary | ICD-10-CM | POA: Diagnosis not present

## 2014-11-30 NOTE — Telephone Encounter (Signed)
Left message on machine to call back  I have placed numerous call to the pt and left multiple messages.  I did mail a copy of the instructions to the home.

## 2014-11-30 NOTE — Telephone Encounter (Signed)
EUS scheduled, pt instructed and medications reviewed.  Patient instructions mailed to home.  Patient to call with any questions or concerns.  

## 2014-12-03 ENCOUNTER — Encounter (HOSPITAL_COMMUNITY): Payer: Self-pay | Admitting: Gastroenterology

## 2014-12-03 ENCOUNTER — Ambulatory Visit (HOSPITAL_COMMUNITY): Payer: Medicare Other | Admitting: Anesthesiology

## 2014-12-03 ENCOUNTER — Ambulatory Visit (HOSPITAL_COMMUNITY)
Admission: RE | Admit: 2014-12-03 | Discharge: 2014-12-03 | Disposition: A | Discharge status: Home / Self Care | Payer: Medicare Other | Source: Ambulatory Visit | Attending: Gastroenterology | Admitting: Gastroenterology

## 2014-12-03 ENCOUNTER — Encounter (HOSPITAL_COMMUNITY)
Admission: RE | Disposition: A | Discharge status: Home / Self Care | Payer: Self-pay | Source: Ambulatory Visit | Attending: Gastroenterology

## 2014-12-03 ENCOUNTER — Telehealth: Payer: Self-pay | Admitting: Family

## 2014-12-03 DIAGNOSIS — C251 Malignant neoplasm of body of pancreas: Secondary | ICD-10-CM | POA: Insufficient documentation

## 2014-12-03 DIAGNOSIS — I10 Essential (primary) hypertension: Secondary | ICD-10-CM | POA: Insufficient documentation

## 2014-12-03 DIAGNOSIS — K869 Disease of pancreas, unspecified: Secondary | ICD-10-CM

## 2014-12-03 DIAGNOSIS — Z86010 Personal history of colonic polyps: Secondary | ICD-10-CM | POA: Diagnosis not present

## 2014-12-03 DIAGNOSIS — K868 Other specified diseases of pancreas: Secondary | ICD-10-CM | POA: Insufficient documentation

## 2014-12-03 DIAGNOSIS — Z9049 Acquired absence of other specified parts of digestive tract: Secondary | ICD-10-CM | POA: Diagnosis not present

## 2014-12-03 DIAGNOSIS — E890 Postprocedural hypothyroidism: Secondary | ICD-10-CM | POA: Insufficient documentation

## 2014-12-03 DIAGNOSIS — Z90710 Acquired absence of both cervix and uterus: Secondary | ICD-10-CM | POA: Insufficient documentation

## 2014-12-03 DIAGNOSIS — K219 Gastro-esophageal reflux disease without esophagitis: Secondary | ICD-10-CM | POA: Insufficient documentation

## 2014-12-03 DIAGNOSIS — M179 Osteoarthritis of knee, unspecified: Secondary | ICD-10-CM | POA: Diagnosis not present

## 2014-12-03 DIAGNOSIS — Z79899 Other long term (current) drug therapy: Secondary | ICD-10-CM | POA: Diagnosis not present

## 2014-12-03 DIAGNOSIS — E119 Type 2 diabetes mellitus without complications: Secondary | ICD-10-CM | POA: Insufficient documentation

## 2014-12-03 DIAGNOSIS — Z90722 Acquired absence of ovaries, bilateral: Secondary | ICD-10-CM | POA: Diagnosis not present

## 2014-12-03 DIAGNOSIS — K8689 Other specified diseases of pancreas: Secondary | ICD-10-CM

## 2014-12-03 DIAGNOSIS — M8580 Other specified disorders of bone density and structure, unspecified site: Secondary | ICD-10-CM | POA: Insufficient documentation

## 2014-12-03 DIAGNOSIS — C259 Malignant neoplasm of pancreas, unspecified: Secondary | ICD-10-CM

## 2014-12-03 HISTORY — PX: EUS: SHX5427

## 2014-12-03 SURGERY — UPPER ENDOSCOPIC ULTRASOUND (EUS) LINEAR
Anesthesia: Monitor Anesthesia Care

## 2014-12-03 MED ORDER — SODIUM CHLORIDE 0.9 % IV SOLN: INTRAVENOUS | Status: DC

## 2014-12-03 MED ORDER — LACTATED RINGERS IV SOLN
INTRAVENOUS | Status: DC
  Administered 2014-12-03: 09:00:00 via INTRAVENOUS

## 2014-12-03 MED ORDER — PROPOFOL 10 MG/ML IV BOLUS
INTRAVENOUS | Status: DC | PRN
  Administered 2014-12-03: 100 mg via INTRAVENOUS

## 2014-12-03 MED ORDER — PROPOFOL 10 MG/ML IV BOLUS
INTRAVENOUS | Status: AC
  Filled 2014-12-03: qty 20

## 2014-12-03 MED ORDER — PROPOFOL INFUSION 10 MG/ML OPTIME
INTRAVENOUS | Status: DC | PRN
  Administered 2014-12-03: 140 ug/kg/min via INTRAVENOUS

## 2014-12-03 NOTE — Anesthesia Postprocedure Evaluation (Signed)
  Anesthesia Post-op Note  Patient: Alison [name]  Procedure(s) Performed: Procedure(s) (LRB): UPPER ENDOSCOPIC ULTRASOUND (EUS) LINEAR (N/A)  Patient Location: PACU  Anesthesia Type: MAC  Level of Consciousness: awake and alert   Airway and Oxygen Therapy: Patient Spontanous Breathing  Post-op Pain: mild  Post-op Assessment: Post-op Vital signs reviewed, Patient's Cardiovascular Status Stable, Respiratory Function Stable, Patent Airway and No signs of Nausea or vomiting  Last Vitals:  Filed Vitals:   12/03/14 1040  BP: 123/89  Pulse: 64  Temp: 36.9 C  Resp: 14    Post-op Vital Signs: stable   Complications: No apparent anesthesia complications

## 2014-12-03 NOTE — H&P (View-Only) (Signed)
Subjective:    Patient ID: Alison [name], female    DOB: 1934-03-22, 81 y.o.   MRN: [id number]  HPI  Patient here to discuss PET scan results. She is accompanied today by her husband and niece.   She initially presented 09/07/14- with complaint of anorexia and weight loss.  TSH was normal at that time, and pt reported a significant amount of stress.  She was seen back in the office on 10/12/14 and was noted to have progressive weight loss and ongoing anorexia. This prompted CT abd/pelvis, she was noted to have some LUQ fullness on exam.  Initial CT results noted normal pancreas, but noted severe right hydronephrosis, and RLQ mesenteric lymph nodes and vague soft tissue density more medial within the mesentary. She was referred to Urology (Dr. Louis Meckel) who was able to successfully stent her right ureter.  Cytologic brushings showed reactive cells only.    She was then scheduled for PET scan.  PET scan which was performed this past weekend unfortunately noted:  "Hypermetabolic masses towards the pancreatic tail consists with primary pancreatic adenocarcinoma. The pancreatic masses is immediately adjacent to stomach and would be amenable to ultrasound guided endoscopic biopsy. Recommend gastroenterology consultation for biopsy. 2. Local nodal metastasis along the body of the stomach. 3. Metastatic peritoneal disease with serosal implant along the small bowel in the right lower quadrant. 4. Irregular peritoneal /mesenteric metastasis within the central mesentery and along the course of the right ureter and most certainly etiology of the ureteral obstruction. 5. Interval placement of a right ureteral stent with relief of the hydronephrosis."  Upon further review of the CT abd/pelvis performed in January with Dr. Suzy Bouchard, it was felt that the mass was in fact visible on the CT. An addendum was made to that CT detailing " Hypodense 2.6 x 2.4 cm pancreatic tail mass adjacent to the stomach most  concerning for pancreatic adenocarcinoma. The mass appears to encase a small segment of the mid splenic artery best seen on image 33/series 5 and image 74/series 6. The mesenteric soft tissue nodules likely reflect metastatic disease.  She sees him on 2/22 (Dr. Louis Meckel)   Wt Readings from Last 3 Encounters:  11/24/14 149 lb 9.6 oz (67.858 kg)  11/10/14 151 lb (68.493 kg)  10/14/14 152 lb (68.947 kg)     Past Medical History  Diagnosis Date  . Hypertension   . History of fibrocystic disease of breast   . Osteopenia   . Type 2 diabetes, diet controlled   . Hypothyroidism, postsurgical     S/P  TOTAL THYROIDECTOMY DUE TO RIGHT MULTINODULAR GOITER--  HYPERPLASIA  . GERD (gastroesophageal reflux disease)   . History of adenomatous polyp of colon     TUBULAR ADENOMA-- HYPERPLASTIC  . Hydronephrosis, right   . Nocturia   . Benign positional vertigo   . Arthritis     right knee   . PVC's (premature ventricular contractions)   . Disease of pericardium     unspecified- per dr Stanford Breed note--  per last echo small pericardial effusion 03-11-2013  . Full dentures     Review of Systems    see HPI  Past Medical History  Diagnosis Date  . Hypertension   . History of fibrocystic disease of breast   . Osteopenia   . Type 2 diabetes, diet controlled   . Hypothyroidism, postsurgical     S/P  TOTAL THYROIDECTOMY DUE TO RIGHT MULTINODULAR GOITER--  HYPERPLASIA  . GERD (gastroesophageal reflux disease)   .  History of adenomatous polyp of colon     TUBULAR ADENOMA-- HYPERPLASTIC  . Hydronephrosis, right   . Nocturia   . Benign positional vertigo   . Arthritis     right knee   . PVC's (premature ventricular contractions)   . Disease of pericardium     unspecified- per dr Stanford Breed note--  per last echo small pericardial effusion 03-11-2013  . Full dentures     History   Social History  . Marital Status: Married    Spouse Name: N/A  . Number of Children: N/A  . Years of  Education: N/A   Occupational History  . Retired     [place] Topics  . Smoking status: Never Smoker   . Smokeless tobacco: Never Used  . Alcohol Use: No  . Drug Use: No  . Sexual Activity: Not on file   Other Topics Concern  . Not on file   Social History Narrative    Past Surgical History  Procedure Laterality Date  . Total abdominal hysterectomy w/ bilateral salpingoophorectomy  1984  . Knee arthroscopy Right 12-21-2013  . Thyroidectomy N/A 12/15/2013    Procedure: TOTAL THYROIDECTOMY;  Surgeon: Earnstine Regal, MD;  Location: [facility];  Service: General;  Laterality: N/A;  . Laparoscopic right hemicolectomy  03-12-2001    TUBULAR ADENOMA POLYP  . Colonoscopy w/ polypectomy  last one 2014  . Transthoracic echocardiogram  03-11-2013   dr Stanford Breed    grade I diastolic dysfunction/  ef 55-60%/   mild MR/  trivial PR/  small pericardial effusion identified  . Cystoscopy with retrograde pyelogram, ureteroscopy and stent placement Right 11/10/2014    Procedure: CYSTO/BILATERAL RETROGRADE PYELOGRAM/RIGHT  URETEROSCOPY WITH BRUSH BIOPSY/RIGHT URETERAL STENT PLACEMENT;  Surgeon: Ardis Hughs, MD;  Location: [facility];  Service: Urology;  Laterality: Right;    Family History  Problem Relation Age of Onset  . Hyperlipidemia    . Hypertension    . Esophageal cancer    . Hypertension Mother   . Cancer Father     prostate    Allergies  Allergen Reactions  . Tetanus Toxoid Rash    Current Outpatient Prescriptions on File Prior to Visit  Medication Sig Dispense Refill  . amLODipine (NORVASC) 5 MG tablet TAKE 1 TABLET BY MOUTH EVERY DAY AFTER BREAKFAST 30 tablet 1  . levothyroxine (SYNTHROID, LEVOTHROID) 75 MCG tablet TAKE 1 TABLET (75 MCG TOTAL) BY MOUTH DAILY BEFORE BREAKFAST. 30 tablet 1  . polyethylene glycol (MIRALAX / GLYCOLAX) packet Take 17 g by mouth as needed.    . triamcinolone cream (KENALOG) 0.5 % Apply 1 application  topically as needed. For hand rash 30 g 0  . vitamin C (ASCORBIC ACID) 500 MG tablet Take 500 mg by mouth daily.    . [DISCONTINUED] Calcium Carbonate-Vitamin D (CALTRATE 600+D) 600-400 MG-UNIT per tablet Take 1 tablet by mouth 2 (two) times daily.     No current facility-administered medications on file prior to visit.    BP 120/80 mmHg  Pulse 100  Temp(Src) 98.1 F (36.7 C) (Oral)  Resp 16  Ht 5' 3.5" (1.613 m)  Wt 149 lb 9.6 oz (67.858 kg)  BMI 26.08 kg/m2  SpO2 99%    Objective:    Physical Exam  Constitutional: She appears well-developed and well-nourished.  Cardiovascular: Normal rate, regular rhythm and normal heart sounds.   No murmur heard. Pulmonary/Chest: Effort normal and breath sounds normal. No respiratory distress. She  has no wheezes.  Abdominal: Soft. There is no tenderness.  Firm non-tender epigastric mass noted.    Psychiatric: She has a normal mood and affect. Her behavior is normal. Judgment and thought content normal.      Lab Results  Component Value Date   WBC 5.2 12/10/2013   HGB 15.3* 11/10/2014   HCT 45.0 11/10/2014   PLT 167 12/10/2013   GLUCOSE 115* 11/10/2014   CHOL 195 08/04/2013   TRIG 201* 08/04/2013   HDL 48 08/04/2013   HDL 48 08/04/2013   LDLCALC 107* 08/04/2013   ALT 13 07/06/2010   AST 18 07/06/2010   NA 142 11/10/2014   K 3.8 11/10/2014   CL 104 10/14/2014   CREATININE 1.3* 10/14/2014   BUN 13 10/14/2014   CO2 28 10/14/2014   TSH 2.85 09/07/2014   HGBA1C 6.1* 03/06/2014   MICROALBUR 0.50 03/06/2014    Nm Pet Image Initial (pi) Skull Base To Thigh  11/23/2014   CLINICAL DATA:  Initial treatment strategy for abdominal mass. Abnormal CT scan.  EXAM: NUCLEAR MEDICINE PET SKULL BASE TO THIGH  TECHNIQUE: 7.5 mCi F-18 FDG was injected intravenously. Full-ring PET imaging was performed from the skull base to thigh after the radiotracer. CT data was obtained and used for attenuation correction and anatomic localization.  FASTING  BLOOD GLUCOSE:  Value: 105 mg/dl  COMPARISON:  CT 10/26/2014  FINDINGS: NECK  No hypermetabolic lymph nodes in the neck.  CHEST  No hypermetabolic mediastinal or hilar nodes. No suspicious pulmonary nodules on the CT scan.  ABDOMEN/PELVIS  There is a hypermetabolic mass within the pancreas at the junction of the body and tail measuring approximately 3.9 x 2.4 cm (image 105, series 4) and with hypermetabolic activity (SUV max 6.7). This masses is immediately adjacent to the stomach. There is a adjacent hypermetabolic lymph node along the body of the stomach measuring 13 mm (image 103).  Hypermetabolic serosal implant along the small bowel in the right lower quadrant measuring 16 mm (Image 129) with SUV max 7.0).  Irregular lymph node/peritoneal metastasis along the course of the right ureter on image 121 with associated metabolic activity. Central mesenteric nodes are also hypermetabolic (image 675)  Interval stenting of right ureter.  SKELETON  No focal hypermetabolic activity to suggest skeletal metastasis.  IMPRESSION: 1. Hypermetabolic masses towards the pancreatic tail consists with primary pancreatic adenocarcinoma. The pancreatic masses is immediately adjacent to stomach and would be amenable to ultrasound guided endoscopic biopsy. Recommend gastroenterology consultation for biopsy. 2. Local nodal metastasis along the body of the stomach. 3. Metastatic peritoneal disease with serosal implant along the small bowel in the right lower quadrant. 4. Irregular peritoneal /mesenteric metastasis within the central mesentery and along the course of the right ureter and most certainly etiology of the ureteral obstruction. 5. Interval placement of a right ureteral stent with relief of the hydronephrosis. These results will be called to the ordering clinician or representative by the Radiologist Assistant, and communication documented in the PACS or zVision Dashboard.   Electronically Signed   By: Suzy Bouchard M.D.    On: 11/23/2014 09:21       Assessment & Plan:  Case and plan is reviewed with Dr. Charlett Blake.

## 2014-12-03 NOTE — Transfer of Care (Signed)
Immediate Anesthesia Transfer of Care Note  Patient: Alison [name]  Procedure(s) Performed: Procedure(s) (LRB): UPPER ENDOSCOPIC ULTRASOUND (EUS) LINEAR (N/A)  Patient Location: PACU  Anesthesia Type: MAC  Level of Consciousness: sedated, patient cooperative and responds to stimulation  Airway & Oxygen Therapy: Patient Spontanous Breathing and Patient connected to face mask oxgen  Post-op Assessment: Report given to PACU RN and Post -op Vital signs reviewed and stable  Post vital signs: Reviewed and stable  Complications: No apparent anesthesia complications

## 2014-12-03 NOTE — Anesthesia Preprocedure Evaluation (Signed)
Anesthesia Evaluation  Patient identified by MRN, date of birth, ID band Patient awake    Reviewed: Allergy & Precautions, NPO status , Patient's Chart, lab work & pertinent test results  History of Anesthesia Complications Negative for: history of anesthetic complications  Airway Mallampati: II  TM Distance: >3 FB Neck ROM: Full    Dental  (+) Dental Advisory Given, Edentulous Lower, Edentulous Upper   Pulmonary shortness of breath and with exertion,  breath sounds clear to auscultation  Pulmonary exam normal       Cardiovascular hypertension, Pt. on medications Rhythm:Regular Rate:Normal     Neuro/Psych negative neurological ROS  negative psych ROS   GI/Hepatic Neg liver ROS, GERD-  Medicated and Controlled,  Endo/Other  diabetes, Well Controlled, Type 2Hypothyroidism   Renal/GU   negative genitourinary   Musculoskeletal  (+) Arthritis -, Osteoarthritis,    Abdominal   Peds negative pediatric ROS (+)  Hematology negative hematology ROS (+)   Anesthesia Other Findings   Reproductive/Obstetrics negative OB ROS                             Anesthesia Physical Anesthesia Plan  ASA: III  Anesthesia Plan: MAC   Post-op Pain Management:    Induction:   Airway Management Planned:   Additional Equipment:   Intra-op Plan:   Post-operative Plan:   Informed Consent:   Plan Discussed with: Surgeon  Anesthesia Plan Comments:         Anesthesia Quick Evaluation

## 2014-12-03 NOTE — Op Note (Signed)
[place], [address]   ENDOSCOPIC ULTRASOUND PROCEDURE REPORT  PATIENT: Alison [name], Alison [name]  MR#: [id number] BIRTHDATE: September 29, 1934  GENDER: female ENDOSCOPIST: Milus Banister, MD REFERRED BY:  Debbrah Alar, FNP PROCEDURE DATE:  12/03/2014 PROCEDURE:   Upper EUS w/FNA ASA CLASS:      Class III INDICATIONS:   mass in body of pancreas with multiple intrabdominal PET avid masses, sites of likely spread. MEDICATIONS: Monitored anesthesia care  DESCRIPTION OF PROCEDURE:   After the risks benefits and alternatives of the procedure were  explained, informed consent was obtained. The patient was then placed in the left, lateral, decubitus postion and IV sedation was administered. Throughout the procedure, the patients blood pressure, pulse and oxygen saturations were monitored continuously.  Under direct visualization, the Pentax EUS Radial [id number]  endoscope was introduced through the mouth  and advanced to the second portion of the duodenum .  Water was used as necessary to provide an acoustic interface.  Upon completion of the imaging, water was removed and the patient was sent to the recovery room in satisfactory condition.  Endoscopic findings (limited examination with linear echoendoscope): 1. Normal UGI tract  EUS findings (limited examination given CT, PET findings showing diffuse intrabdominal disease): 1. Large (3.7cm) hypoechoic mass in body of pancreas. This mass clearly involves the splenic artery/vein and causes pancreatic duct dilation in the tail. The mass was sampled with two transgastric passes with a 25 gauge EUS FNA needle, suction.  ENDOSCOPIC IMPRESSION: Large mass in body of pancreas.  Preliminary cytology review was positive for malignancy (adenocarcinoma).  Given PET findings, this is Stage IV.  RECOMMENDATIONS: Medical oncology referral.  I will discuss with Debbrah Alar   _______________________________ eSignedMilus Banister, MD 12/03/2014 10:31 AM

## 2014-12-03 NOTE — Telephone Encounter (Signed)
-----   Message from Milus Banister, MD sent at 12/03/2014 10:34 AM EST ----- Alison [name], Just completed EUS. Full report in EPIC.  As expected this is adenocarcinoma, stage IV given PET findings.   She needs referral to medical oncology. Dr. Burr Medico or Dr. Benay Spice.  I am happy to arrange if you prefer.    Thanks  DJ

## 2014-12-03 NOTE — Telephone Encounter (Signed)
Alison [name], could you please expedite this referral?

## 2014-12-03 NOTE — Discharge Instructions (Signed)
Gastrointestinal Endoscopy, Care After °Refer to this sheet in the next few weeks. These instructions provide you with information on caring for yourself after your procedure. Your caregiver may also give you more specific instructions. Your treatment has been planned according to current medical practices, but problems sometimes occur. Call your caregiver if you have any problems or questions after your procedure. °HOME CARE INSTRUCTIONS °· If you were given medicine to help you relax (sedative), do not drive, operate machinery, or sign important documents for 24 hours. °· Avoid alcohol and hot or warm beverages for the first 24 hours after the procedure. °· Only take over-the-counter or prescription medicines for pain, discomfort, or fever as directed by your caregiver. You may resume taking your normal medicines unless your caregiver tells you otherwise. Ask your caregiver when you may resume taking medicines that may cause bleeding, such as aspirin, clopidogrel, or warfarin. °· You may return to your normal diet and activities on the day after your procedure, or as directed by your caregiver. Walking may help to reduce any bloated feeling in your abdomen. °· Drink enough fluids to keep your urine clear or pale yellow. °· You may gargle with salt water if you have a sore throat. °SEEK IMMEDIATE MEDICAL CARE IF: °· You have severe nausea or vomiting. °· You have severe abdominal pain, abdominal cramps that last longer than 6 hours, or abdominal swelling (distention). °· You have severe shoulder or back pain. °· You have trouble swallowing. °· You have shortness of breath, your breathing is shallow, or you are breathing faster than normal. °· You have a fever or a rapid heartbeat. °· You vomit blood or material that looks like coffee grounds. °· You have bloody, black, or tarry stools. °MAKE SURE YOU: °· Understand these instructions. °· Will watch your condition. °· Will get help right away if you are not doing  well or get worse. °Document Released: 05/09/2004 Document Revised: 02/09/2014 Document Reviewed: 12/26/2011 °ExitCare® Patient Information ©2015 [employer]. This information is not intended to replace advice given to you by your health care provider. Make sure you discuss any questions you have with your health care provider. ° °

## 2014-12-03 NOTE — Telephone Encounter (Signed)
Lm w/ [facility]/awaiting return call

## 2014-12-03 NOTE — Interval H&P Note (Signed)
History and Physical Interval Note:  12/03/2014 8:53 AM  Alison [name]  has presented today for surgery, with the diagnosis of panc mass  The various methods of treatment have been discussed with the patient and family. After consideration of risks, benefits and other options for treatment, the patient has consented to  Procedure(s): UPPER ENDOSCOPIC ULTRASOUND (EUS) LINEAR (N/A) as a surgical intervention .  The patient's history has been reviewed, patient examined, no change in status, stable for surgery.  I have reviewed the patient's chart and labs.  Questions were answered to the patient's satisfaction.     Milus Banister

## 2014-12-04 ENCOUNTER — Encounter (HOSPITAL_COMMUNITY): Payer: Self-pay | Admitting: Gastroenterology

## 2014-12-04 ENCOUNTER — Telehealth: Payer: Self-pay | Admitting: Oncology

## 2014-12-04 NOTE — Telephone Encounter (Signed)
S/W PT IN REF TO NP APPT. 12/11/14@10 :30

## 2014-12-07 ENCOUNTER — Encounter: Payer: Self-pay | Admitting: Internal Medicine

## 2014-12-07 ENCOUNTER — Telehealth: Payer: Self-pay | Admitting: Oncology

## 2014-12-07 ENCOUNTER — Ambulatory Visit (INDEPENDENT_AMBULATORY_CARE_PROVIDER_SITE_OTHER): Payer: Medicare Other | Admitting: Internal Medicine

## 2014-12-07 VITALS — BP 152/86 | HR 85 | Temp 98.2°F | Resp 16 | Ht 63.0 in | Wt 149.0 lb

## 2014-12-07 DIAGNOSIS — C259 Malignant neoplasm of pancreas, unspecified: Secondary | ICD-10-CM

## 2014-12-07 NOTE — Assessment & Plan Note (Signed)
She got results from Dr. Ardis Hughs and went back over that with her today and her daughter. Talked about the poor prognosis even with treatment of <6 months (since she has abdominal mets). She will meet with oncology this Friday and talk about her options. Advised her that she has the right to choose for her body. She will keep an open mind with the oncologist but is not sure she wants to do treatment as she feels this will rob her of her function and quality of life. She is feeling good now and has minimal symptoms. No pain, nausea, jaundice, sugar problems. Will continue to follow and talk with her. Palliative care or hospice may be very appropriate for her soon. Follow up in 1 month. Call with problems sooner.

## 2014-12-07 NOTE — Telephone Encounter (Signed)
Chart delivered 12/07/14

## 2014-12-07 NOTE — Progress Notes (Signed)
Pre visit review using our clinic review tool, if applicable. No additional management support is needed unless otherwise documented below in the visit note. 

## 2014-12-07 NOTE — Progress Notes (Signed)
   Subjective:    Patient ID: Alison [name], female    DOB: 11-23-1933, 81 y.o.   MRN: [id number]  HPI The patient is a 81 YO female who is coming in today to talk about her recent diagnosis of pancreatic cancer. She is going to see oncology on Friday. She denies any pain or nausea. She has been losing weight which is why they found this out. She has a lot of questions and opinions about what she should do. She is functionally doing well now and wants to stay independent and functional for as long as possible and values that more than total life time. She has been sad a little bit but has lots of family for support and they have been keeping her spirits up.   PMH, [facility], social history reviewed and updated today.   Review of Systems  Constitutional: Negative for fever, activity change, appetite change, fatigue and unexpected weight change.  HENT: Negative.   Respiratory: Negative for cough, chest tightness, shortness of breath and wheezing.   Cardiovascular: Negative for chest pain, palpitations and leg swelling.  Gastrointestinal: Negative for nausea, abdominal pain, diarrhea, constipation and abdominal distention.  Genitourinary: Negative.   Musculoskeletal: Negative.   Skin: Negative.   Neurological: Negative.   Psychiatric/Behavioral: Negative.       Objective:   Physical Exam  Constitutional: She is oriented to person, place, and time. She appears well-developed and well-nourished.  HENT:  Head: Normocephalic and atraumatic.  Eyes: EOM are normal.  Neck: Normal range of motion.  Cardiovascular: Normal rate and regular rhythm.   Pulmonary/Chest: Effort normal and breath sounds normal. No respiratory distress. She has no wheezes.  Abdominal: Soft. Bowel sounds are normal.  Musculoskeletal: She exhibits no edema.  Neurological: She is alert and oriented to person, place, and time. Coordination normal.  Skin: Skin is warm and dry.  Psychiatric: She has a normal mood and affect.    Filed Vitals:   12/07/14 0849  BP: 152/86  Pulse: 85  Temp: 98.2 F (36.8 C)  TempSrc: Oral  Resp: 16  Height: 5\' 3"  (1.6 m)  Weight: 149 lb (67.586 kg)  SpO2: 97%      Assessment & Plan:  Visit time 40 minutes, greater than 50% of which was face to face in counseling with the patient and her daughter.

## 2014-12-07 NOTE — Patient Instructions (Signed)
Go to the appointment on Friday and hear what they have to say.  You are the boss of your own body and you can decide what you want to do or what you do not want to do.   If you have any questions about anything afterwards please feel free to come back or call us.

## 2014-12-11 ENCOUNTER — Ambulatory Visit: Payer: Medicare Other

## 2014-12-11 ENCOUNTER — Ambulatory Visit (HOSPITAL_BASED_OUTPATIENT_CLINIC_OR_DEPARTMENT_OTHER): Payer: Medicare Other | Admitting: Oncology

## 2014-12-11 ENCOUNTER — Encounter: Payer: Self-pay | Admitting: Oncology

## 2014-12-11 ENCOUNTER — Telehealth: Payer: Self-pay | Admitting: Oncology

## 2014-12-11 VITALS — BP 169/77 | HR 92 | Temp 98.0°F | Resp 18 | Ht 63.0 in | Wt 147.8 lb

## 2014-12-11 DIAGNOSIS — R634 Abnormal weight loss: Secondary | ICD-10-CM | POA: Diagnosis not present

## 2014-12-11 DIAGNOSIS — C252 Malignant neoplasm of tail of pancreas: Secondary | ICD-10-CM

## 2014-12-11 DIAGNOSIS — C786 Secondary malignant neoplasm of retroperitoneum and peritoneum: Secondary | ICD-10-CM | POA: Diagnosis not present

## 2014-12-11 DIAGNOSIS — N1330 Unspecified hydronephrosis: Secondary | ICD-10-CM

## 2014-12-11 DIAGNOSIS — C251 Malignant neoplasm of body of pancreas: Secondary | ICD-10-CM

## 2014-12-11 DIAGNOSIS — Z808 Family history of malignant neoplasm of other organs or systems: Secondary | ICD-10-CM

## 2014-12-11 DIAGNOSIS — C259 Malignant neoplasm of pancreas, unspecified: Secondary | ICD-10-CM

## 2014-12-11 NOTE — Progress Notes (Signed)
Checked in new pt with no financial concerns prior to seeing the dr.  Informed pt if she needs assistance with her out of pocket expense I can contact foundations that offer copay assistance.  She has my card for any billing questions or concerns.

## 2014-12-11 NOTE — [facility] Oncology Navigator Note (Signed)
Met with patient, husband and daughter during new patient visit. Explained the role of the GI Nurse Navigator and provided New Patient Packet with information on: 1.  Pancreatic cancer 2. Support groups 3. Advanced Directives 4. Fall Safety Plan Answered questions, reviewed current treatment plan, which is supportive care at this time using TEACH back and provided emotional support. Provided copy of current treatment plan. Will order nutritionist referral at family request.  Merceda Elks, RN, BSN GI Oncology [place]

## 2014-12-11 NOTE — Telephone Encounter (Signed)
gv and printed appt sched and avs for pt  °

## 2014-12-11 NOTE — Patient Instructions (Signed)
[place] Discharge Instructions  RECOMMENDATIONS MADE BY THE CONSULTANT AND ANY TEST RESULTS WILL BE SENT TO YOUR REFERRING PHYSICIAN.  EXAM FINDINGS BY THE PHYSICIAN TODAY AND SIGNS OR SYMPTOMS TO REPORT TO CLINIC OR PRIMARY PHYSICIAN:   You have stage IV cancer of the pancreas according to current findings-seems to be a slow growing cancer in the tail of pancreas  MEDICATIONS PRESCRIBED:   N/A  INSTRUCTIONS GIVEN AND DISCUSSED  Follow up with Dr. Benay Spice in 4-6 weeks  SPECIAL INSTRUCTIONS/FOLLOW-UP:  Options for care would be supportive care-control pain, nausea, swelling to keep you comfortable for as long as possible  Could try chemotherapy Gemzar & Abraxane given every 2 weeks for 4-5 treatments and then repeat CT scan and check tumor marker CA19-9  Thank you for choosing [facility] to provide your oncology and hematology care.  To afford each patient quality time with our providers, please arrive at least 30 minutes before your scheduled appointment time.  With your help, our goal is to use those 30 minutes to complete the necessary work-up to ensure our physicians have the information they need to help with your evaluation and healthcare recommendations.     ___________________  Should you have questions after your visit to [facility], please contact our office at [phone] between the hours of 8:30 a.m. and 4:30 p.m.  Voicemails left after 4:00 p.m. will not be returned until the following business day.  For prescription refill requests, have your pharmacy contact our office with your prescription refill request. We request 24 hour notice for all refill requests.

## 2014-12-11 NOTE — Progress Notes (Signed)
[place] New Patient Consult   Referring MD: Reanna Scoggin 81 y.o.  Aug 22, 1934    Reason for Referral: Pancreas cancer   HPI: Ms. Yearick reports a history of weight loss dating to November 2015. An initial evaluation by her primary physician was nondiagnostic. She was noted to have progressive weight loss and anorexia in January. A CT of the abdomen and pelvis on 10/16/2014 revealed severe right Hydro ureterohydronephrosis with transition to a normal caliber at the level of the sacrum. An enlarged right mesenteric lymph node measured 14 mm. 1.9 cm soft tissue density in the right lower quadrant along the wall of a small bowel loop. A 2.6 cm mass was noted in the tail the pancreas adjacent to the stomach. She was referred to urology and underwent placement of a right ureter stent 11/10/2014.  She was referred to Dr. Ardis Hughs and was taken to an endoscopic ultrasound procedure 12/03/2014. A 3.7 cm hypoechoic mass was noted in the body of the pancreas with involvement of the splenic artery/vein and pancreatic duct dilatation. The mass was biopsied. The biopsy revealed malignant cells consistent with adenocarcinoma and tumor necrosis. The tumor cells stained positive for cytokeratin 7, CDX-2, and negative for cytokeratin 20.  She was referred for a staging PET scan on 11/23/2014. This confirmed a hypermetabolic mass in the pancreas at the junction of the body and tail. An adjacent hypermetabolic lymph node along the body of the stomach. A hypermetabolic serosal implant in the right lower quadrant measured 16 mm. An irregular peritoneal metastasis was noted at the right ureter. Central mesenteric lymph nodes are hypermetabolic.  She has no specific complaint today.   Past Medical History  Diagnosis Date  . Hypertension   . History of fibrocystic disease of breast   . Osteopenia   . Type 2 diabetes, diet controlled   . Hypothyroidism, postsurgical     S/P   TOTAL THYROIDECTOMY DUE TO RIGHT MULTINODULAR GOITER--  HYPERPLASIA  . GERD (gastroesophageal reflux disease)   . History of adenomatous polyp of colon     TUBULAR ADENOMA-- HYPERPLASTIC  . Hydronephrosis, right  January 2016   . Nocturia   . Benign positional vertigo   . Arthritis     right knee   . PVC's (premature ventricular contractions)   . Disease of pericardium     unspecified- per dr Stanford Breed note--  per last echo small pericardial effusion 03-11-2013  . Full dentures   . Cancer  January 2016     pancreatic   .    G3 P3  Past Surgical History  Procedure Laterality Date  . Total abdominal hysterectomy w/ bilateral salpingoophorectomy  1984  . Knee arthroscopy Right 12-21-2013  . Thyroidectomy N/A 12/15/2013    Procedure: TOTAL THYROIDECTOMY;  Surgeon: Earnstine Regal, MD;  Location: [facility];  Service: General;  Laterality: N/A;  . Laparoscopic right hemicolectomy  03-12-2001    TUBULAR ADENOMA POLYP  . Colonoscopy w/ polypectomy  last one 2014  . Transthoracic echocardiogram  03-11-2013   dr Stanford Breed    grade I diastolic dysfunction/  ef 55-60%/   mild MR/  trivial PR/  small pericardial effusion identified  . Cystoscopy with retrograde pyelogram, ureteroscopy and stent placement Right 11/10/2014    Procedure: CYSTO/BILATERAL RETROGRADE PYELOGRAM/RIGHT  URETEROSCOPY WITH BRUSH BIOPSY/RIGHT URETERAL STENT PLACEMENT;  Surgeon: Ardis Hughs, MD;  Location[facility];  Service: Urology;  Laterality: Right;  . Eus N/A 12/03/2014  Procedure: UPPER ENDOSCOPIC ULTRASOUND (EUS) LINEAR;  Surgeon: Milus Banister, MD;  Location: [facility];  Service: Endoscopy;  Laterality: N/A;    Medications: Reviewed  Allergies:  Allergies  Allergen Reactions  . Tetanus Toxoid Rash    Family history: She had 11 siblings. A brother had prostate cancer. Another brother had head and neck cancer. Her sister died of pancreas cancer in her 29s. Her father had prostate cancer. No  other family history of cancer.  Social History:   She lives with her husband and [place]. She worked at [employer] for 30 years. She does not use tobacco or alcohol. No transfusion history. No risk factor for HIV or hepatitis.  ROS:   Positives include: 15 pound weight loss, constipation when eating certain foods  A complete ROS was otherwise negative.  Physical Exam:  Blood pressure 169/77, pulse 92, temperature 98 F (36.7 C), temperature source Oral, resp. rate 18, height 5\' 3"  (1.6 m), weight 147 lb 12.8 oz (67.042 kg), SpO2 100 %.  HEENT: Upper and lower denture plate, oral cavity without visible mass, neck without mass, the pharynx was poorly visualized Lungs: Clear bilaterally Cardiac: Regular rate and rhythm Abdomen: No hepatosplenic the, no apparent ascites, nontender, no mass  Vascular: No leg edema Lymph nodes: No cervical, supra-clavicular, axillary, or inguinal nodes Neurologic: Alert and oriented, the motor exam appears intact in the upper and lower extremities Skin: No rash Musculoskeletal: No spine tenderness   LAB:  CBC  Lab Results  Component Value Date   WBC 5.2 12/10/2013   HGB 15.3* 11/10/2014   HCT 45.0 11/10/2014   MCV 89.7 12/10/2013   PLT 167 12/10/2013   NEUTROABS 2.3 07/06/2010     CMP      Component Value Date/Time   NA 138 11/24/2014 1353   K 3.6 11/24/2014 1353   CL 104 11/24/2014 1353   CO2 29 11/24/2014 1353   GLUCOSE 148* 11/24/2014 1353   BUN 16 11/24/2014 1353   CREATININE 1.14 11/24/2014 1353   CREATININE 0.86 03/06/2014 1220   CALCIUM 9.5 11/24/2014 1353   PROT 6.6 07/06/2010 2016   ALBUMIN 4.0 07/06/2010 2016   AST 18 07/06/2010 2016   ALT 13 07/06/2010 2016   ALKPHOS 85 07/06/2010 2016   BILITOT 0.6 07/06/2010 2016   GFRNONAA 65 03/06/2014 1220   GFRNONAA 78* 12/16/2013 0520   GFRAA 75 03/06/2014 1220   GFRAA >90 12/16/2013 0520      Imaging:  I reviewed the 11/23/2014 PET scan and 10/16/2014  CT   Assessment/Plan:   1. Pancreas cancer, stage IV, pancreas body/tail mass with metastatic adenopathy and peritoneal implants  EUS 12/03/2014 confirmed a 3.7 cm hypoechoic pancreas body mass, status post an FNA biopsy confirming adenocarcinoma  PET scan 11/23/2014 confirmed a hypermetabolic mass at the pancreas body/tail with an adjacent hypermetabolic lymph node and peritoneal implants 2. Right hydronephrosis secondary to a peritoneal implant, status post placement of a right ureter stent 11/10/2014  3.    Weight loss secondary to #1  4.    Family history pancreas cancer-sister   Disposition:   Ms. Mogensen has been diagnosed with pancreas cancer. She appears to have metastatic disease. No therapy will be curative. I discussed treatment options with Ms. Zeiner and her family. We discussed observation versus a trial of systemic chemotherapy.  She indicated that she does not wish to receive chemotherapy. She prefers a supportive care approach.  I recommended she be seen in the genetics screening clinic  given her family history of pancreas cancer. She declines this for now.  Ms. Spranger will return for an office visit in approximately one month. We will see her in the interim as needed.   [place], [place] 12/11/2014, 2:33 PM

## 2014-12-18 ENCOUNTER — Telehealth: Payer: Self-pay | Admitting: Internal Medicine

## 2014-12-18 ENCOUNTER — Other Ambulatory Visit: Payer: Self-pay | Admitting: Geriatric Medicine

## 2014-12-18 ENCOUNTER — Other Ambulatory Visit: Payer: Self-pay | Admitting: Family

## 2014-12-18 ENCOUNTER — Telehealth: Payer: Self-pay | Admitting: *Deleted

## 2014-12-18 MED ORDER — LEVOTHYROXINE SODIUM 75 MCG PO TABS: ORAL_TABLET | ORAL | Status: DC

## 2014-12-18 MED ORDER — AMLODIPINE BESYLATE 5 MG PO TABS: ORAL_TABLET | ORAL | Status: DC

## 2014-12-18 NOTE — Telephone Encounter (Signed)
Patient is requesting refill of levothyroxine and blood pressure med to be sent to [facility] on [address].

## 2014-12-18 NOTE — Telephone Encounter (Signed)
Called patient in follow up. She reports she is eating well, having no pain. Staying active and getting out of the house. Denies any needs at this time. She will call for any new pain. Verbalized appreciation for the call.

## 2014-12-18 NOTE — Telephone Encounter (Signed)
Sent to pharmacy 

## 2015-01-12 ENCOUNTER — Other Ambulatory Visit: Payer: Self-pay | Admitting: Family

## 2015-01-12 NOTE — Telephone Encounter (Signed)
Medication Detail      Disp Refills Start End     levothyroxine (SYNTHROID, LEVOTHROID) 75 MCG tablet 30 tablet 3 12/18/2014     Sig: TAKE 1 TABLET (75 MCG TOTAL) BY MOUTH DAILY BEFORE BREAKFAST.    E-Prescribing Status: Receipt confirmed by pharmacy (12/18/2014 9:32 AM EST)     Pharmacy    [id number] [address]   Rx request Denied; Lastr Rx to pharmacy 03.11.16 #30 with 3 remaining refills/sls

## 2015-01-13 ENCOUNTER — Ambulatory Visit: Payer: Medicare Other | Admitting: Oncology

## 2015-02-02 ENCOUNTER — Inpatient Hospital Stay (HOSPITAL_COMMUNITY)
Admission: EM | Admit: 2015-02-02 | Discharge: 2015-02-04 | DRG: 683 | Disposition: A | Discharge status: Hospice | Payer: Medicare Other | Attending: Family Medicine | Admitting: Family Medicine

## 2015-02-02 ENCOUNTER — Emergency Department (HOSPITAL_COMMUNITY): Payer: Medicare Other

## 2015-02-02 ENCOUNTER — Encounter (HOSPITAL_COMMUNITY): Payer: Self-pay | Admitting: Emergency Medicine

## 2015-02-02 ENCOUNTER — Telehealth: Payer: Self-pay | Admitting: Internal Medicine

## 2015-02-02 DIAGNOSIS — E890 Postprocedural hypothyroidism: Secondary | ICD-10-CM | POA: Diagnosis present

## 2015-02-02 DIAGNOSIS — Z79899 Other long term (current) drug therapy: Secondary | ICD-10-CM

## 2015-02-02 DIAGNOSIS — Z86010 Personal history of colonic polyps: Secondary | ICD-10-CM

## 2015-02-02 DIAGNOSIS — Z8507 Personal history of malignant neoplasm of pancreas: Secondary | ICD-10-CM | POA: Diagnosis not present

## 2015-02-02 DIAGNOSIS — K868 Other specified diseases of pancreas: Secondary | ICD-10-CM | POA: Diagnosis not present

## 2015-02-02 DIAGNOSIS — I10 Essential (primary) hypertension: Secondary | ICD-10-CM | POA: Diagnosis present

## 2015-02-02 DIAGNOSIS — M1990 Unspecified osteoarthritis, unspecified site: Secondary | ICD-10-CM | POA: Diagnosis present

## 2015-02-02 DIAGNOSIS — K219 Gastro-esophageal reflux disease without esophagitis: Secondary | ICD-10-CM | POA: Diagnosis not present

## 2015-02-02 DIAGNOSIS — Z800 Family history of malignant neoplasm of digestive organs: Secondary | ICD-10-CM

## 2015-02-02 DIAGNOSIS — Z66 Do not resuscitate: Secondary | ICD-10-CM | POA: Diagnosis present

## 2015-02-02 DIAGNOSIS — N139 Obstructive and reflux uropathy, unspecified: Secondary | ICD-10-CM | POA: Diagnosis not present

## 2015-02-02 DIAGNOSIS — E875 Hyperkalemia: Secondary | ICD-10-CM | POA: Diagnosis not present

## 2015-02-02 DIAGNOSIS — E119 Type 2 diabetes mellitus without complications: Secondary | ICD-10-CM | POA: Diagnosis not present

## 2015-02-02 DIAGNOSIS — C7989 Secondary malignant neoplasm of other specified sites: Secondary | ICD-10-CM | POA: Diagnosis present

## 2015-02-02 DIAGNOSIS — Z515 Encounter for palliative care: Secondary | ICD-10-CM | POA: Diagnosis not present

## 2015-02-02 DIAGNOSIS — C786 Secondary malignant neoplasm of retroperitoneum and peritoneum: Secondary | ICD-10-CM | POA: Diagnosis present

## 2015-02-02 DIAGNOSIS — N1330 Unspecified hydronephrosis: Secondary | ICD-10-CM | POA: Diagnosis present

## 2015-02-02 DIAGNOSIS — C259 Malignant neoplasm of pancreas, unspecified: Secondary | ICD-10-CM | POA: Diagnosis present

## 2015-02-02 DIAGNOSIS — Z90710 Acquired absence of both cervix and uterus: Secondary | ICD-10-CM | POA: Diagnosis not present

## 2015-02-02 DIAGNOSIS — K921 Melena: Secondary | ICD-10-CM | POA: Diagnosis not present

## 2015-02-02 DIAGNOSIS — R5383 Other fatigue: Secondary | ICD-10-CM | POA: Diagnosis not present

## 2015-02-02 DIAGNOSIS — Z8249 Family history of ischemic heart disease and other diseases of the circulatory system: Secondary | ICD-10-CM

## 2015-02-02 DIAGNOSIS — D490 Neoplasm of unspecified behavior of digestive system: Secondary | ICD-10-CM | POA: Diagnosis not present

## 2015-02-02 DIAGNOSIS — N179 Acute kidney failure, unspecified: Secondary | ICD-10-CM | POA: Diagnosis not present

## 2015-02-02 DIAGNOSIS — R630 Anorexia: Secondary | ICD-10-CM | POA: Diagnosis not present

## 2015-02-02 LAB — COMPREHENSIVE METABOLIC PANEL
ALT: 30 U/L (ref 0–35)
AST: 14 U/L (ref 0–37)
Albumin: 3.6 g/dL (ref 3.5–5.2)
Alkaline Phosphatase: 103 U/L (ref 39–117)
Anion gap: 13 (ref 5–15)
BUN: 82 mg/dL — ABNORMAL HIGH (ref 6–23)
CO2: 22 mmol/L (ref 19–32)
Calcium: 8.5 mg/dL (ref 8.4–10.5)
Chloride: 97 mmol/L (ref 96–112)
Creatinine, Ser: 9 mg/dL — ABNORMAL HIGH (ref 0.50–1.10)
GFR calc non Af Amer: 4 mL/min — ABNORMAL LOW (ref >90)
GFR, EST AFRICAN AMERICAN: 4 mL/min — AB (ref >90)
GLUCOSE: 127 mg/dL — AB (ref 70–99)
Potassium: 6.2 mmol/L (ref 3.5–5.1)
Sodium: 132 mmol/L — ABNORMAL LOW (ref 135–145)
Total Bilirubin: 0.4 mg/dL (ref 0.3–1.2)
Total Protein: 6.3 g/dL (ref 6.0–8.3)

## 2015-02-02 LAB — CBC WITH DIFFERENTIAL/PLATELET
Basophils Absolute: 0 10*3/uL (ref 0.0–0.1)
Basophils Relative: 0 % (ref 0–1)
EOS PCT: 0 % (ref 0–5)
Eosinophils Absolute: 0 10*3/uL (ref 0.0–0.7)
HEMATOCRIT: 32.1 % — AB (ref 36.0–46.0)
Hemoglobin: 10.9 g/dL — ABNORMAL LOW (ref 12.0–15.0)
LYMPHS PCT: 19 % (ref 12–46)
Lymphs Abs: 1.3 10*3/uL (ref 0.7–4.0)
MCH: 30.1 pg (ref 26.0–34.0)
MCHC: 34 g/dL (ref 30.0–36.0)
MCV: 88.7 fL (ref 78.0–100.0)
Monocytes Absolute: 0.5 10*3/uL (ref 0.1–1.0)
Monocytes Relative: 7 % (ref 3–12)
NEUTROS ABS: 4.9 10*3/uL (ref 1.7–7.7)
NEUTROS PCT: 74 % (ref 43–77)
Platelets: 172 10*3/uL (ref 150–400)
RBC: 3.62 MIL/uL — ABNORMAL LOW (ref 3.87–5.11)
RDW: 12.7 % (ref 11.5–15.5)
WBC: 6.7 10*3/uL (ref 4.0–10.5)

## 2015-02-02 LAB — URINALYSIS, ROUTINE W REFLEX MICROSCOPIC
Bilirubin Urine: NEGATIVE
Glucose, UA: NEGATIVE mg/dL
Ketones, ur: NEGATIVE mg/dL
Nitrite: NEGATIVE
Protein, ur: NEGATIVE mg/dL
SPECIFIC GRAVITY, URINE: 1.015 (ref 1.005–1.030)
UROBILINOGEN UA: 0.2 mg/dL (ref 0.0–1.0)
pH: 6.5 (ref 5.0–8.0)

## 2015-02-02 LAB — URINE MICROSCOPIC-ADD ON

## 2015-02-02 MED ORDER — HEPARIN SODIUM (PORCINE) 5000 UNIT/ML IJ SOLN
5000.0000 [IU] | Freq: Three times a day (TID) | INTRAMUSCULAR | Status: DC
  Administered 2015-02-02: 5000 [IU] via SUBCUTANEOUS
  Filled 2015-02-02 (×8): qty 1

## 2015-02-02 MED ORDER — ENSURE ENLIVE PO LIQD
237.0000 mL | Freq: Two times a day (BID) | ORAL | Status: DC
  Administered 2015-02-03: 237 mL via ORAL

## 2015-02-02 MED ORDER — INSULIN ASPART 100 UNIT/ML ~~LOC~~ SOLN
10.0000 [IU] | Freq: Once | SUBCUTANEOUS | Status: AC
  Administered 2015-02-02: 10 [IU] via SUBCUTANEOUS
  Filled 2015-02-02: qty 1

## 2015-02-02 MED ORDER — SODIUM CHLORIDE 0.9 % IV SOLN
1.0000 g | Freq: Once | INTRAVENOUS | Status: AC
  Administered 2015-02-02: 1 g via INTRAVENOUS
  Filled 2015-02-02: qty 10

## 2015-02-02 MED ORDER — SODIUM POLYSTYRENE SULFONATE 15 GM/60ML PO SUSP
30.0000 g | Freq: Once | ORAL | Status: AC
  Administered 2015-02-02: 30 g via ORAL
  Filled 2015-02-02: qty 120

## 2015-02-02 MED ORDER — SODIUM CHLORIDE 0.9 % IV BOLUS (SEPSIS)
1000.0000 mL | Freq: Once | INTRAVENOUS | Status: AC
  Administered 2015-02-02: 1000 mL via INTRAVENOUS

## 2015-02-02 MED ORDER — AMLODIPINE BESYLATE 5 MG PO TABS
5.0000 mg | ORAL_TABLET | Freq: Every day | ORAL | Status: DC
  Administered 2015-02-02 – 2015-02-04 (×3): 5 mg via ORAL
  Filled 2015-02-02 (×3): qty 1

## 2015-02-02 MED ORDER — SODIUM CHLORIDE 0.9 % IV SOLN
INTRAVENOUS | Status: DC
  Administered 2015-02-02 – 2015-02-03 (×3): via INTRAVENOUS

## 2015-02-02 MED ORDER — ACETAMINOPHEN 325 MG PO TABS
650.0000 mg | ORAL_TABLET | Freq: Four times a day (QID) | ORAL | Status: DC | PRN
  Administered 2015-02-03 (×2): 650 mg via ORAL
  Administered 2015-02-04: 325 mg via ORAL
  Administered 2015-02-04: 650 mg via ORAL
  Filled 2015-02-02 (×5): qty 2

## 2015-02-02 MED ORDER — ACETAMINOPHEN 650 MG RE SUPP: 650.0000 mg | Freq: Four times a day (QID) | RECTAL | Status: DC | PRN

## 2015-02-02 MED ORDER — LEVOTHYROXINE SODIUM 75 MCG PO TABS
75.0000 ug | ORAL_TABLET | Freq: Every day | ORAL | Status: DC
  Administered 2015-02-03 – 2015-02-04 (×2): 75 ug via ORAL
  Filled 2015-02-02 (×3): qty 1

## 2015-02-02 MED ORDER — IOHEXOL 300 MG/ML  SOLN
50.0000 mL | Freq: Once | INTRAMUSCULAR | Status: AC | PRN
  Administered 2015-02-02: 50 mL via ORAL

## 2015-02-02 MED ORDER — DEXTROSE 50 % IV SOLN
1.0000 | Freq: Once | INTRAVENOUS | Status: AC
Start: 2015-02-02 — End: 2015-02-02
  Administered 2015-02-02: 50 mL via INTRAVENOUS
  Filled 2015-02-02: qty 50

## 2015-02-02 MED ORDER — HYDROMORPHONE HCL 1 MG/ML IJ SOLN: 0.5000 mg | INTRAMUSCULAR | Status: DC | PRN

## 2015-02-02 NOTE — ED Notes (Signed)
Patient transported to CT 

## 2015-02-02 NOTE — ED Notes (Signed)
Patient transported to X-ray 

## 2015-02-02 NOTE — Progress Notes (Signed)
Utilization Review completed.  Evelisse Szalkowski RN CM  

## 2015-02-02 NOTE — H&P (Addendum)
PCP:   Olga Millers, MD   Chief Complaint:  Poor by mouth intake  HPI:  81 year old female who  has a past medical history of Hypertension; History of fibrocystic disease of breast; Osteopenia; Type 2 diabetes, diet controlled; Hypothyroidism, postsurgical; GERD (gastroesophageal reflux disease); History of adenomatous polyp of colon; Hydronephrosis, right; Nocturia; Benign positional vertigo; Arthritis; PVC's (premature ventricular contractions); Disease of pericardium; Full dentures; and Cancer. Patient was recently diagnosed with stage IV pancreatic cancer with PET scan showing hypermetabolic lymph node and peritoneal implants. Patient was seen by Dr. Ammie Dalton as outpatient and was offered conservative management versus chemotherapy. Patient opted for conservative management. As per daughter she was doing fine at home until a few days ago when she started having poor by mouth intake. She also had one episode of vomiting on Sunday, no diarrhea. No fever chest pain or shortness of breath.  A CT scan of the abdomen and pelvis on 10/16/2014 revealed severe right hydroureter ureter nephrosis with transition to normal caliber at level of sacrum. Patient is status post ureteral stent placement.  In the ED today patient was found to have acute kidney injury with creatinine of 9.0, potassium 6.2 . Patient is not a dialysis candidate due to stage IV pancreatic cancer. Neurology was consulted and the ED physician, and Dr. Posey Pronto recommended to keep the patient at [facility] . Patient will be given 1 dose of Kayexalate and calcium gluconate in the ED.   Allergies:   Allergies  Allergen Reactions  . Tetanus Toxoid Rash      Past Medical History  Diagnosis Date  . Hypertension   . History of fibrocystic disease of breast   . Osteopenia   . Type 2 diabetes, diet controlled   . Hypothyroidism, postsurgical     S/P  TOTAL THYROIDECTOMY DUE TO RIGHT MULTINODULAR GOITER--  HYPERPLASIA    . GERD (gastroesophageal reflux disease)   . History of adenomatous polyp of colon     TUBULAR ADENOMA-- HYPERPLASTIC  . Hydronephrosis, right   . Nocturia   . Benign positional vertigo   . Arthritis     right knee   . PVC's (premature ventricular contractions)   . Disease of pericardium     unspecified- per dr Stanford Breed note--  per last echo small pericardial effusion 03-11-2013  . Full dentures   . Cancer     pancreatic    Past Surgical History  Procedure Laterality Date  . Total abdominal hysterectomy w/ bilateral salpingoophorectomy  1984  . Knee arthroscopy Right 12-21-2013  . Thyroidectomy N/A 12/15/2013    Procedure: TOTAL THYROIDECTOMY;  Surgeon: Earnstine Regal, MD;  Location: [facility];  Service: General;  Laterality: N/A;  . Laparoscopic right hemicolectomy  03-12-2001    TUBULAR ADENOMA POLYP  . Colonoscopy w/ polypectomy  last one 2014  . Transthoracic echocardiogram  03-11-2013   dr Stanford Breed    grade I diastolic dysfunction/  ef 55-60%/   mild MR/  trivial PR/  small pericardial effusion identified  . Cystoscopy with retrograde pyelogram, ureteroscopy and stent placement Right 11/10/2014    Procedure: CYSTO/BILATERAL RETROGRADE PYELOGRAM/RIGHT  URETEROSCOPY WITH BRUSH BIOPSY/RIGHT URETERAL STENT PLACEMENT;  Surgeon: Ardis Hughs, MD;  Location: [facility];  Service: Urology;  Laterality: Right;  . Eus N/A 12/03/2014    Procedure: UPPER ENDOSCOPIC ULTRASOUND (EUS) LINEAR;  Surgeon: Milus Banister, MD;  Location: [facility];  Service: Endoscopy;  Laterality: N/A;    Prior to Admission  medications   Medication Sig Start Date End Date Taking? Authorizing Provider  amLODipine (NORVASC) 5 MG tablet TAKE 1 TABLET BY MOUTH EVERY DAY AFTER BREAKFAST 12/18/14  Yes Olga Millers, MD  levothyroxine (SYNTHROID, LEVOTHROID) 75 MCG tablet TAKE 1 TABLET (75 MCG TOTAL) BY MOUTH DAILY BEFORE BREAKFAST. 12/18/14  Yes Olga Millers, MD  polyethylene glycol  [facility] / GLYCOLAX) packet Take 17 g by mouth daily as needed for mild constipation.    Yes Historical Provider, MD  triamcinolone cream (KENALOG) 0.5 % Apply 1 application topically as needed. For hand rash 08/04/13  Yes Debbrah Alar, NP  VESICARE 5 MG tablet Take 5 mg by mouth daily. 01/12/15  Yes Historical Provider, MD  vitamin C (ASCORBIC ACID) 500 MG tablet Take 500 mg by mouth daily.   Yes Historical Provider, MD    Social History:  reports that she has never smoked. She has never used smokeless tobacco. She reports that she does not drink alcohol or use illicit drugs.  Family History  Problem Relation Age of Onset  . Hyperlipidemia    . Hypertension    . Esophageal cancer    . Hypertension Mother   . Cancer Father     prostate    Patient sister had  pancreatic cancer    All the positives are listed in BOLD  Review of Systems:  HEENT: Headache, blurred vision, runny nose, sore throat Neck: Hypothyroidism, hyperthyroidism,,lymphadenopathy Chest : Shortness of breath, history of COPD, Asthma Heart : Chest pain, history of coronary arterey disease GI:  Nausea, vomiting, diarrhea, constipation, GERD GU: Dysuria, urgency, frequency of urination, hematuria Neuro: Stroke, seizures, syncope Psych: Depression, anxiety, hallucinations   Physical Exam: Blood pressure 155/79, pulse 79, temperature 98.3 F (36.8 C), temperature source Oral, resp. rate 17, SpO2 100 %. Constitutional:   Patient is a well-developed and well-nourished female  in no acute distress and cooperative with exam. Head: Normocephalic and atraumatic Mouth: Mucus membranes moist Eyes: PERRL, EOMI, conjunctivae normal Neck: Supple, No Thyromegaly Cardiovascular: RRR, S1 normal, S2 normal Pulmonary/Chest: CTAB, no wheezes, rales, or rhonchi Abdominal: Soft. Non-tender, non-distended, bowel sounds are normal, no masses, organomegaly, or guarding present.  Neurological: A&O x3, Strength is normal and  symmetric bilaterally, cranial nerve II-XII are grossly intact, no focal motor deficit, sensory intact to light touch bilaterally.  Extremities : No Cyanosis, Clubbing or Edema  Labs on Admission:  Basic Metabolic Panel:  Recent Labs Lab 02/02/15 1409  NA 132*  K 6.2*  CL 97  CO2 22  GLUCOSE 127*  BUN 82*  CREATININE 9.00*  CALCIUM 8.5   Liver Function Tests:  Recent Labs Lab 02/02/15 1409  AST 14  ALT 30  ALKPHOS 103  BILITOT 0.4  PROT 6.3  ALBUMIN 3.6   No results for input(s): LIPASE, AMYLASE in the last 168 hours. No results for input(s): AMMONIA in the last 168 hours. CBC:  Recent Labs Lab 02/02/15 1409  WBC 6.7  NEUTROABS 4.9  HGB 10.9*  HCT 32.1*  MCV 88.7  PLT 172   Radiological Exams on Admission: Dg Abd 1 View  02/02/2015   CLINICAL DATA:  Pancreatic cancer. Loss of appetite. Loss of energy. Small amount of blood in bowel movement.  EXAM: ABDOMEN - 1 VIEW  COMPARISON:  CT 10/16/2014  FINDINGS: Right ureteral stent in place. Nonobstructive bowel gas pattern. No free air organomegaly. No suspicious calcification. No acute bony abnormality.  IMPRESSION: No acute findings.   Electronically Signed   By:  Rolm Baptise M.D.   On: 02/02/2015 15:43    EKG: Independently reviewed. Sinus rhythm    Assessment/Plan Active Problems:   Essential hypertension   Hypothyroidism, postsurgical   Pancreatic adenocarcinoma   AKI (acute kidney injury)   Acute kidney injury Patient's baseline creatinine is 1.14 as of February 2016, today her creatinine is 9.0 with BUN 82. Nephrology was consulted by the ED physician, Dr. Posey Pronto at this time recommends to keep the patient at [facility] as she is not a dialysis candidate due to stage IV pancreatic cancer. CT scan of the abdomen pelvis has been ordered without contrast, to check for possible hydro-nephrosis continued into patient's renal failure. I will start the patient on normal saline at 75 per hour. Follow renal  functions in a.m.  Hyperkalemia Potassium 6.2 Patient given Kayexalate and calcium gluconate in the ED. Follow BMP in a.m.  Stage IV pancreatic cancer Patient has opted for comfort care approach, no chemotherapy.  Not requiring any pain medication at this time Will start Dilaudid when necessary for pain  Hypothyroidism Continue Synthroid    Hypertension Continue amlodipine  DVT prophylaxis Heparin  Code status:DO NOT RESUSCITATE   Family discussion: Admission, patients condition and plan of care including tests being ordered have been discussed with the patient and *her daughter and niece at bedside* who indicate understanding and agree with the plan and Code Status.   Time Spent on Admission: 60 min  [place] [facility] Pager: [phone] 02/02/2015, 4:18 PM  If 7PM-7AM, please contact night-coverage  [email or web address]  Password [email or web address]

## 2015-02-02 NOTE — ED Notes (Signed)
MD at bedside. [facility] HOSPITALIST MD MAKING PT AND FAMILY AWARE OF ADMISSION

## 2015-02-02 NOTE — ED Notes (Signed)
Pt with pancreatic cancer reports no appetite for "a couple of days" and not feeling well with loss of energy.  Denies abdominal pain. Pt reports constipation this am and saw "a little red blood" after straining to have a bowel movement this am. Pt ambulatory and in NAD.

## 2015-02-02 NOTE — ED Provider Notes (Signed)
CSN: [id number]     Arrival date & time 02/02/15  1219 History   First MD Initiated Contact with Patient 02/02/15 1335     Chief Complaint  Patient presents with  . Pancreatic Cancer  . No Appetite      (Consider location/radiation/quality/duration/timing/severity/associated sxs/prior Treatment) HPI  This is a 81 year old female with a history of hypertension, hypothyroidism, metastatic pancreatic cancer who presents with generalized malaise, energy loss, and decreased by mouth intake. Patient reports worsening symptoms over the last several days. Reports nausea without vomiting. Reports last normal bowel movement was yesterday. States that she generally just doesn't fill well and has had decrease by mouth intake. Denies dysuria or hematuria. Does state that she has had decreased urine output. Denies any fevers, chest pain, shortness of breath.  Reviewed charts. Patient has metastatic pancreatic cancer. Not a candidate for treatment. Per discussions with patient and her daughter, she is DO NOT RESUSCITATE.  Past Medical History  Diagnosis Date  . Hypertension   . History of fibrocystic disease of breast   . Osteopenia   . Type 2 diabetes, diet controlled   . Hypothyroidism, postsurgical     S/P  TOTAL THYROIDECTOMY DUE TO RIGHT MULTINODULAR GOITER--  HYPERPLASIA  . GERD (gastroesophageal reflux disease)   . History of adenomatous polyp of colon     TUBULAR ADENOMA-- HYPERPLASTIC  . Hydronephrosis, right   . Nocturia   . Benign positional vertigo   . Arthritis     right knee   . PVC's (premature ventricular contractions)   . Disease of pericardium     unspecified- per dr Stanford Breed note--  per last echo small pericardial effusion 03-11-2013  . Full dentures   . Cancer     pancreatic   Past Surgical History  Procedure Laterality Date  . Total abdominal hysterectomy w/ bilateral salpingoophorectomy  1984  . Knee arthroscopy Right 12-21-2013  . Thyroidectomy N/A 12/15/2013   Procedure: TOTAL THYROIDECTOMY;  Surgeon: Earnstine Regal, MD;  Location: [facility];  Service: General;  Laterality: N/A;  . Laparoscopic right hemicolectomy  03-12-2001    TUBULAR ADENOMA POLYP  . Colonoscopy w/ polypectomy  last one 2014  . Transthoracic echocardiogram  03-11-2013   dr Stanford Breed    grade I diastolic dysfunction/  ef 55-60%/   mild MR/  trivial PR/  small pericardial effusion identified  . Cystoscopy with retrograde pyelogram, ureteroscopy and stent placement Right 11/10/2014    Procedure: CYSTO/BILATERAL RETROGRADE PYELOGRAM/RIGHT  URETEROSCOPY WITH BRUSH BIOPSY/RIGHT URETERAL STENT PLACEMENT;  Surgeon: Ardis Hughs, MD;  [facility];  Service: Urology;  Laterality: Right;  . Eus N/A 12/03/2014    Procedure: UPPER ENDOSCOPIC ULTRASOUND (EUS) LINEAR;  Surgeon: Milus Banister, MD;  Location: [facility];  Service: Endoscopy;  Laterality: N/A;   Family History  Problem Relation Age of Onset  . Hyperlipidemia    . Hypertension    . Esophageal cancer    . Hypertension Mother   . Cancer Father     prostate   History  Substance Use Topics  . Smoking status: Never Smoker   . Smokeless tobacco: Never Used  . Alcohol Use: No   OB History    No data available     Review of Systems  Constitutional: Positive for appetite change. Negative for fever.  Respiratory: Negative for cough, chest tightness and shortness of breath.   Cardiovascular: Negative for chest pain.  Gastrointestinal: Positive for nausea and constipation. Negative for vomiting and  abdominal pain.  Genitourinary: Negative for dysuria.       Decreased urination  Musculoskeletal: Negative for back pain.  Neurological: Negative for headaches.  All other systems reviewed and are negative.     Allergies  Tetanus toxoid  Home Medications   Prior to Admission medications   Medication Sig Start Date End Date Taking? Authorizing Provider  amLODipine (NORVASC) 5 MG tablet TAKE 1  TABLET BY MOUTH EVERY DAY AFTER BREAKFAST 12/18/14  Yes Olga Millers, MD  levothyroxine (SYNTHROID, LEVOTHROID) 75 MCG tablet TAKE 1 TABLET (75 MCG TOTAL) BY MOUTH DAILY BEFORE BREAKFAST. 12/18/14  Yes Olga Millers, MD  polyethylene glycol [facility] / GLYCOLAX) packet Take 17 g by mouth daily as needed for mild constipation.    Yes Historical Provider, MD  triamcinolone cream (KENALOG) 0.5 % Apply 1 application topically as needed. For hand rash 08/04/13  Yes Debbrah Alar, NP  VESICARE 5 MG tablet Take 5 mg by mouth daily. 01/12/15  Yes Historical Provider, MD  vitamin C (ASCORBIC ACID) 500 MG tablet Take 500 mg by mouth daily.   Yes Historical Provider, MD   BP 155/79 mmHg  Pulse 79  Temp(Src) 98.3 F (36.8 C) (Oral)  Resp 17  SpO2 100% Physical Exam  Constitutional: She is oriented to person, place, and time. She appears well-developed and well-nourished.  Elderly, no acute distress  HENT:  Head: Normocephalic and atraumatic.  Mucous membranes dry  Eyes: Pupils are equal, round, and reactive to light.  Cardiovascular: Normal rate, regular rhythm and normal heart sounds.   No murmur heard. Pulmonary/Chest: Effort normal and breath sounds normal. No respiratory distress.  Abdominal: Soft. Bowel sounds are normal. There is no tenderness. There is no rebound and no guarding.  Neurological: She is alert and oriented to person, place, and time.  Skin: Skin is warm and dry.  Psychiatric: She has a normal mood and affect.  Nursing note and vitals reviewed.   ED Course  Procedures (including critical care time)  CRITICAL CARE Performed by: Merryl Hacker   Total critical care time: 35 min  Critical care time was exclusive of separately billable procedures and treating other patients.  Critical care was necessary to treat or prevent imminent or life-threatening deterioration.  Critical care was time spent personally by me on the following activities: development of  treatment plan with patient and/or surrogate as well as nursing, discussions with consultants, evaluation of patient's response to treatment, examination of patient, obtaining history from patient or surrogate, ordering and performing treatments and interventions, ordering and review of laboratory studies, ordering and review of radiographic studies, pulse oximetry and re-evaluation of patient's condition.  Labs Review Labs Reviewed  CBC WITH DIFFERENTIAL/PLATELET - Abnormal; Notable for the following:    RBC 3.62 (*)    Hemoglobin 10.9 (*)    HCT 32.1 (*)    All other components within normal limits  COMPREHENSIVE METABOLIC PANEL - Abnormal; Notable for the following:    Sodium 132 (*)    Potassium 6.2 (*)    Glucose, Bld 127 (*)    BUN 82 (*)    Creatinine, Ser 9.00 (*)    GFR calc non Af Amer 4 (*)    GFR calc Af Amer 4 (*)    All other components within normal limits  URINALYSIS, ROUTINE W REFLEX MICROSCOPIC - Abnormal; Notable for the following:    APPearance CLOUDY (*)    Hgb urine dipstick LARGE (*)    Leukocytes, UA SMALL (*)  All other components within normal limits  URINE MICROSCOPIC-ADD ON - Abnormal; Notable for the following:    Squamous Epithelial / LPF MANY (*)    Bacteria, UA FEW (*)    All other components within normal limits    Imaging Review Dg Abd 1 View  02/02/2015   CLINICAL DATA:  Pancreatic cancer. Loss of appetite. Loss of energy. Small amount of blood in bowel movement.  EXAM: ABDOMEN - 1 VIEW  COMPARISON:  CT 10/16/2014  FINDINGS: Right ureteral stent in place. Nonobstructive bowel gas pattern. No free air organomegaly. No suspicious calcification. No acute bony abnormality.  IMPRESSION: No acute findings.   Electronically Signed   By: Rolm Baptise M.D.   On: 02/02/2015 15:43     EKG Interpretation   Date/Time:  Tuesday February 02 2015 15:51:12 EDT Ventricular Rate:  77 PR Interval:  195 QRS Duration: 93 QT Interval:  398 QTC Calculation: 450 R  Axis:   -7 Text Interpretation:  Sinus rhythm Confirmed by HORTON  MD, Loma Sousa  ([id number]) on 02/02/2015 3:56:09 PM      MDM   Final diagnoses:  Acute kidney injury  Hyperkalemia  Malignant neoplasm of pancreas, unspecified location of malignancy    Patient presents with generalized malaise, nausea, and decreased by mouth intake. Nontoxic on exam. Vital signs are reassuring. History of pancreatic cancer which is not treatable. Basic labwork obtained. Patient given normal saline bolus as she appears clinically dry. Workup notable for a creatinine of 9 and a potassium of 6.2. No acute EKG changes. Patient given insulin, glucose.  Suspect obstruction given history of ureteral stent previously. Will obtain a noncontrasted CT scan to further evaluate. Nephrology consulted. Suspect patient will not be a dialysis candidate given her prognosis. Dr. Darrick Meigs to admit.  Per Dr. Posey Pronto, not a dialysis candidate. We'll give Kayexalate and calcium gluconate. Medically managed. Patient may need urology if obstructed.  Merryl Hacker, MD 02/02/15 [phone]

## 2015-02-02 NOTE — Telephone Encounter (Signed)
Pt daughter called in said that pt is not eating and getting worse due to the cancer. Pt is not getting any treatments for it.   Best number [phone] -Diane

## 2015-02-02 NOTE — Telephone Encounter (Signed)
Please schedule an office visit.

## 2015-02-02 NOTE — ED Notes (Signed)
[facility] RN TRANSPORTED THIS PT

## 2015-02-03 ENCOUNTER — Ambulatory Visit: Payer: Medicare Other | Admitting: Internal Medicine

## 2015-02-03 DIAGNOSIS — N179 Acute kidney failure, unspecified: Principal | ICD-10-CM

## 2015-02-03 DIAGNOSIS — E890 Postprocedural hypothyroidism: Secondary | ICD-10-CM

## 2015-02-03 DIAGNOSIS — C259 Malignant neoplasm of pancreas, unspecified: Secondary | ICD-10-CM

## 2015-02-03 DIAGNOSIS — I10 Essential (primary) hypertension: Secondary | ICD-10-CM

## 2015-02-03 LAB — CBC
HEMATOCRIT: 26.9 % — AB (ref 36.0–46.0)
HEMOGLOBIN: 9.3 g/dL — AB (ref 12.0–15.0)
MCH: 30.7 pg (ref 26.0–34.0)
MCHC: 34.6 g/dL (ref 30.0–36.0)
MCV: 88.8 fL (ref 78.0–100.0)
Platelets: 133 10*3/uL — ABNORMAL LOW (ref 150–400)
RBC: 3.03 MIL/uL — AB (ref 3.87–5.11)
RDW: 12.8 % (ref 11.5–15.5)
WBC: 5.7 10*3/uL (ref 4.0–10.5)

## 2015-02-03 LAB — COMPREHENSIVE METABOLIC PANEL
ALBUMIN: 3 g/dL — AB (ref 3.5–5.2)
ALT: 51 U/L — ABNORMAL HIGH (ref 0–35)
AST: 55 U/L — ABNORMAL HIGH (ref 0–37)
Alkaline Phosphatase: 105 U/L (ref 39–117)
Anion gap: 10 (ref 5–15)
BUN: 82 mg/dL — AB (ref 6–23)
CALCIUM: 8.3 mg/dL — AB (ref 8.4–10.5)
CO2: 21 mmol/L (ref 19–32)
Chloride: 102 mmol/L (ref 96–112)
Creatinine, Ser: 9.13 mg/dL — ABNORMAL HIGH (ref 0.50–1.10)
GFR calc non Af Amer: 4 mL/min — ABNORMAL LOW (ref >90)
GFR, EST AFRICAN AMERICAN: 4 mL/min — AB (ref >90)
Glucose, Bld: 107 mg/dL — ABNORMAL HIGH (ref 70–99)
POTASSIUM: 5.3 mmol/L — AB (ref 3.5–5.1)
Sodium: 133 mmol/L — ABNORMAL LOW (ref 135–145)
TOTAL PROTEIN: 5.4 g/dL — AB (ref 6.0–8.3)
Total Bilirubin: 0.5 mg/dL (ref 0.3–1.2)

## 2015-02-03 MED ORDER — BISACODYL 10 MG RE SUPP
10.0000 mg | Freq: Once | RECTAL | Status: AC
  Administered 2015-02-03: 10 mg via RECTAL
  Filled 2015-02-03: qty 1

## 2015-02-03 MED ORDER — BOOST / RESOURCE BREEZE PO LIQD
1.0000 | Freq: Three times a day (TID) | ORAL | Status: DC
  Administered 2015-02-03 – 2015-02-04 (×3): 1 via ORAL

## 2015-02-03 MED ORDER — SODIUM POLYSTYRENE SULFONATE 15 GM/60ML PO SUSP
30.0000 g | Freq: Once | ORAL | Status: AC
  Administered 2015-02-03: 30 g via ORAL
  Filled 2015-02-03: qty 120

## 2015-02-03 NOTE — Consult Note (Signed)
Urology Consult   Physician requesting consult: Dr. Nada Libman, Hospitalist  Reason for consult: Acute renal failure, bilateral hydronephrosis from malignant obstruction  History of Present Illness: Alison [name] is a 81 y.o. with recently diagnosed stage 4 pancreatic cancer who elected conservative management over chemotherapy. She was found to have severe right hydronephrosis on a CT scan 10/16/2014 with increase in her Cr from 0.8 to 1.3 over the course of a few months. She underwent right ureteral stent placement by Dr. Louis Meckel on 11/10/14.   She was reportedly doing well until a few days ago at which point she began having anorexia and vomiting. She was found to have severe AKI with Cr of 9 and hyperkalemia of 6.2. CT scan showed mild residual right hydronephrosis and increased left hydroureteronephrosis with a new bulky left-sided pelvic mass adjacent to her vaginal cuff. She is not a dialysis candidate due to her pancreatic cancer. Her Cr continues to be above 9 today and urology was consulted to recommendations on management of her hydronephrosis and AKI. She denies flank pain, nausea/vomiting, fevers or chills. She does complain of a headache and constipation.  Past Medical History  Diagnosis Date  . Hypertension   . History of fibrocystic disease of breast   . Osteopenia   . Type 2 diabetes, diet controlled   . Hypothyroidism, postsurgical     S/P  TOTAL THYROIDECTOMY DUE TO RIGHT MULTINODULAR GOITER--  HYPERPLASIA  . GERD (gastroesophageal reflux disease)   . History of adenomatous polyp of colon     TUBULAR ADENOMA-- HYPERPLASTIC  . Hydronephrosis, right   . Nocturia   . Benign positional vertigo   . Arthritis     right knee   . PVC's (premature ventricular contractions)   . Disease of pericardium     unspecified- per dr Stanford Breed note--  per last echo small pericardial effusion 03-11-2013  . Full dentures   . Cancer     pancreatic    Past Surgical History  Procedure  Laterality Date  . Total abdominal hysterectomy w/ bilateral salpingoophorectomy  1984  . Knee arthroscopy Right 12-21-2013  . Thyroidectomy N/A 12/15/2013    Procedure: TOTAL THYROIDECTOMY;  Surgeon: Earnstine Regal, MD;  Location: [facility];  Service: General;  Laterality: N/A;  . Laparoscopic right hemicolectomy  03-12-2001    TUBULAR ADENOMA POLYP  . Colonoscopy w/ polypectomy  last one 2014  . Transthoracic echocardiogram  03-11-2013   dr Stanford Breed    grade I diastolic dysfunction/  ef 55-60%/   mild MR/  trivial PR/  small pericardial effusion identified  . Cystoscopy with retrograde pyelogram, ureteroscopy and stent placement Right 11/10/2014    Procedure: CYSTO/BILATERAL RETROGRADE PYELOGRAM/RIGHT  URETEROSCOPY WITH BRUSH BIOPSY/RIGHT URETERAL STENT PLACEMENT;  Surgeon: Ardis Hughs, MD;  Location: [facility];  Service: Urology;  Laterality: Right;  . Eus N/A 12/03/2014    Procedure: UPPER ENDOSCOPIC ULTRASOUND (EUS) LINEAR;  Surgeon: Milus Banister, MD;  Location: [facility];  Service: Endoscopy;  Laterality: N/A;     Current Hospital Medications:  Home meds:    Medication List    ASK your doctor about these medications        amLODipine 5 MG tablet  Commonly known as:  NORVASC  TAKE 1 TABLET BY MOUTH EVERY DAY AFTER BREAKFAST     levothyroxine 75 MCG tablet  Commonly known as:  SYNTHROID, LEVOTHROID  TAKE 1 TABLET (75 MCG TOTAL) BY MOUTH DAILY BEFORE BREAKFAST.  polyethylene glycol packet  Commonly known as:  MIRALAX / GLYCOLAX  Take 17 g by mouth daily as needed for mild constipation.     triamcinolone cream 0.5 %  Commonly known as:  KENALOG  Apply 1 application topically as needed. For hand rash     VESICARE 5 MG tablet  Generic drug:  solifenacin  Take 5 mg by mouth daily.     vitamin C 500 MG tablet  Commonly known as:  ASCORBIC ACID  Take 500 mg by mouth daily.        Scheduled Meds: . amLODipine  5 mg Oral Daily  . bisacodyl  10  mg Rectal Once  . feeding supplement (RESOURCE BREEZE)  1 Container Oral TID BM  . heparin  5,000 Units Subcutaneous 3 times per day  . levothyroxine  75 mcg Oral QAC breakfast  . sodium polystyrene  30 g Oral Once   Continuous Infusions: . sodium chloride 75 mL/hr at 02/03/15 1021   PRN Meds:.acetaminophen **OR** acetaminophen, HYDROmorphone (DILAUDID) injection  Allergies:  Allergies  Allergen Reactions  . Tetanus Toxoid Rash    Family History  Problem Relation Age of Onset  . Hyperlipidemia    . Hypertension    . Esophageal cancer    . Hypertension Mother   . Cancer Father     prostate    Social History:  reports that she has never smoked. She has never used smokeless tobacco. She reports that she does not drink alcohol or use illicit drugs.  ROS: A complete review of systems was performed.  All systems are negative except for pertinent findings as noted.  Physical Exam:  Vital signs in last 24 hours: Temp:  [98.4 F (36.9 C)-98.5 F (36.9 C)] 98.5 F (36.9 C) (04/27 1400) Pulse Rate:  [74-83] 83 (04/27 1400) Resp:  [16-18] 18 (04/27 1400) BP: (139-148)/(64-84) 139/84 mmHg (04/27 1400) SpO2:  [100 %] 100 % (04/27 1400) Constitutional:  Alert and oriented, No acute distress Cardiovascular: Regular rate and rhythm, No JVD Respiratory: Normal respiratory effort, Lungs clear bilaterally GI: Abdomen is soft, nontender, nondistended, no abdominal masses GU: No CVA tenderness Lymphatic: No lymphadenopathy Neurologic: Grossly intact, no focal deficits Psychiatric: Normal mood and affect  Laboratory Data:   Recent Labs  02/02/15 1409 02/03/15 0416  WBC 6.7 5.7  HGB 10.9* 9.3*  HCT 32.1* 26.9*  PLT 172 133*     Recent Labs  02/02/15 1409 02/03/15 0416  NA 132* 133*  K 6.2* 5.3*  CL 97 102  GLUCOSE 127* 107*  BUN 82* 82*  CALCIUM 8.5 8.3*  CREATININE 9.00* 9.13*    Radiologic Imaging: Ct Abdomen Pelvis Wo Contrast  02/02/2015   CLINICAL DATA:   Decreased appetite.  History of pancreas cancer.  EXAM: CT ABDOMEN AND PELVIS WITHOUT CONTRAST  TECHNIQUE: Multidetector CT imaging of the abdomen and pelvis was performed following the standard protocol without IV contrast.  COMPARISON:  10/16/2014  FINDINGS: Lower chest: The lung bases appear clear. Mild pleural thickening in the posterior left lung base noted.  Hepatobiliary: No focal liver abnormality identified. The gallbladder appears filled with sludge or small stones and is collapsed. No biliary dilatation.  Pancreas: Margins of known pancreatic tail mass are difficult to identify due to lack of contrast. This measures approximately 4.7 x 2.6 cm, image 26/ series 2. Previously 3.4 x 2.2 cm.  Spleen: Unremarkable.  Adrenals/Urinary Tract: Normal appearance of the adrenal glands. Right-sided nephro ureteral stent is in place. There is continued  right hydronephrosis. New left hydronephrosis and hydroureter is identified to the level of the pelvis. Urinary bladder appears normal.  Stomach/Bowel: The stomach appears normal. The small bowel loops have a normal caliber without evidence for bowel obstruction. Postoperative changes from right hemicolectomy identified. The mid and distal large bowel loops have a normal caliber without obstruction.  Vascular/Lymphatic: Calcified atherosclerotic disease involves the abdominal aorta. No aneurysm. No retroperitoneal or pelvic adenopathy. No inguinal adenopathy noted. Tumor encasement of the superior mesenteric vein is identified. Again noted is a collateral vessel arising from the portal venous confluence, image 32 of series 2.  Reproductive: Previous hysterectomy.  Other: Extensive peritoneal spread of tumor is identified within the abdomen and pelvis. Soft tissue lesion within the lower abdominal mesenteric measures 2.3 x 2.0 cm, image 48/series 2. This appears to encase the superior mesenteric vein On the previous exam this measured 1.4 x 1.2 cm. Peritoneal deposit  within the right iliac fossa measures 1.9 by 1.4 cm, image 49/series 2. Previously this measured 1.5 x 1.1 cm. New peritoneal lesion along the undersurface of the ventral abdominal wall measures 1 cm, image 44/series 2. Increased soft tissue within the lower pelvis associated with the left side of vaginal cuff measures 3.6 cm, image 68/series 2 previously 1.8 cm. This likely accounts for the new left-sided obstructive uropathy.  Musculoskeletal: There is no aggressive lytic or sclerotic bone lesion. Multi level degenerative disc disease noted within the lumbar spine. Anterolisthesis of L5 on S1 is noted.  IMPRESSION: 1. There has been interval progression of trans peritoneal spread of tumor within the abdomen and pelvis. 2. New left-sided obstructive uropathy which is felt a likely be secondary to a tumor deposit within the pelvis near the left side of vaginal cuff. 3. Status post right-sided nephro ureteral stenting with persistent right hydro nephrosis. 4. Mesenteric tumor appears to encase the superior mesenteric vein. Cannot confirm patency of the SMV secondary to lack of IV contrast material. 5. Increase in size of pancreatic mass.   Electronically Signed   By: Kerby Moors M.D.   On: 02/02/2015 17:45   Dg Abd 1 View  02/02/2015   CLINICAL DATA:  Pancreatic cancer. Loss of appetite. Loss of energy. Small amount of blood in bowel movement.  EXAM: ABDOMEN - 1 VIEW  COMPARISON:  CT 10/16/2014  FINDINGS: Right ureteral stent in place. Nonobstructive bowel gas pattern. No free air organomegaly. No suspicious calcification. No acute bony abnormality.  IMPRESSION: No acute findings.   Electronically Signed   By: Rolm Baptise M.D.   On: 02/02/2015 15:43    I independently reviewed the above imaging studies.  Impression/Recommendation:  1. Non-oliguric acute renal failure - secondary to malignant obstruction from bulky retroperitoneal and pelvic metastases. Ureteral stents are unlikely to provide adequate  drainage as this disease progresses and causes increasing extrinsic compression. Best option for urinary drainage is bilateral percutaneous nephrostomy tubes. Other option is placing a left sided stent and exchanging right stent. Observation is another very reasonable option depending on her goals of care, but is likely to be terminal.  2. Bilateral hydronephrosis - secondary to malignant obstruction, though right sided hydro may be partially due to reflux through stent. As above, indwelling ureteral stents are not likely to provide adequate drainage due to the progressive extrinsic compression and bilateral percutaneous nephrostomy tubes are the only way to ensure adequate drainage if she elects to go this route.   Had a long and detailed discussion with the patient and several  family members about the options for management. They have a clear understanding of the prognosis of her pancreatic cancer and of untreated malignant obstruction. They also have a clear understanding of the risks and benefits of bilateral perc tubes, bilateral stents, and observation. They will think about their options and make a decision about treatment when they are ready. I told them that if they decided they wanted either stents or PCNs, that she should not eat after midnight. I believe they are leaning towards conservative management, but they may be considering placing a left sided stent.

## 2015-02-03 NOTE — Progress Notes (Signed)
error 

## 2015-02-03 NOTE — Progress Notes (Signed)
INITIAL NUTRITION ASSESSMENT  DOCUMENTATION CODES Per approved criteria  -Not Applicable   INTERVENTION: D/C Ensure supplement Provide Resource Breeze po TID, each supplement provides 250 kcal and 9 grams of protein Encourage PO intake RD to continue to monitor  NUTRITION DIAGNOSIS: Increased nutrient needs related to advanced stage cancer as evidenced by estimated nutritional needs.   Goal: Pt to meet >/= 90% of their estimated nutrition needs   Monitor:  PO and supplemental intake, weight, labs, I/O's  Reason for Assessment: Pt identified as at nutrition risk on the Malnutrition Screen Tool  Admitting Dx: <principal problem not specified>  ASSESSMENT: 81 year old female was recently diagnosed with stage IV pancreatic cancer with PET scan showing hypermetabolic lymph node and peritoneal implants.Patient was seen by Dr. Ammie Dalton as outpatient and was offered conservative management versus chemotherapy. As per daughter she was doing fine at home until a few days ago when she started having poor by mouth intake.  Pt in room with visitor at bedside. Pt reports not liking Ensure/Boost supplements as they are too sweet. RN provided pt with Resource Breeze cut with ginger ale and pt liked this better. RD to order.  Per RN, pt ate 1/2 of her breakfast of cereal and 3/4 of lunch which was baked chicken and sweet potatoes.  Pt with steady weight over the last 3 months.  Nutrition focused physical exam shows no sign of depletion of muscle mass or body fat.  Labs reviewed: Low Na Elevated K, BUN & Creatinine  Height: Ht Readings from Last 1 Encounters:  02/02/15 5\' 3"  (1.6 m)    Weight: Wt Readings from Last 1 Encounters:  02/02/15 149 lb (67.586 kg)    Ideal Body Weight: 115 lb  % Ideal Body Weight: 130%  Wt Readings from Last 10 Encounters:  02/02/15 149 lb (67.586 kg)  12/11/14 147 lb 12.8 oz (67.042 kg)  12/07/14 149 lb (67.586 kg)  12/03/14 140 lb (63.504 kg)   11/24/14 149 lb 9.6 oz (67.858 kg)  11/10/14 151 lb (68.493 kg)  10/14/14 152 lb (68.947 kg)  09/07/14 155 lb 9.6 oz (70.58 kg)  05/12/14 162 lb (73.483 kg)  03/06/14 160 lb 1.9 oz (72.63 kg)    Usual Body Weight: 150 lb -per pt  % Usual Body Weight: 100%  BMI:  Body mass index is 26.4 kg/(m^2).  Estimated Nutritional Needs: Kcal: 1700-1900 Protein: 90-100g Fluid: 1.7L/day  Skin: intact  Diet Order: Diet regular Room service appropriate?: Yes; Fluid consistency:: Thin  EDUCATION NEEDS: -Education needs addressed   Intake/Output Summary (Last 24 hours) at 02/03/15 1359 Last data filed at 02/03/15 0600  Gross per 24 hour  Intake 3047.5 ml  Output    125 ml  Net 2922.5 ml    Last BM: 4/27  Labs:   Recent Labs Lab 02/02/15 1409 02/03/15 0416  NA 132* 133*  K 6.2* 5.3*  CL 97 102  CO2 22 21  BUN 82* 82*  CREATININE 9.00* 9.13*  CALCIUM 8.5 8.3*  GLUCOSE 127* 107*    CBG (last 3)  No results for input(s): GLUCAP in the last 72 hours.  Scheduled Meds: . amLODipine  5 mg Oral Daily  . feeding supplement (ENSURE ENLIVE)  237 mL Oral BID BM  . heparin  5,000 Units Subcutaneous 3 times per day  . levothyroxine  75 mcg Oral QAC breakfast    Continuous Infusions: . sodium chloride 75 mL/hr at 02/03/15 1021    Past Medical History  Diagnosis Date  .  Hypertension   . History of fibrocystic disease of breast   . Osteopenia   . Type 2 diabetes, diet controlled   . Hypothyroidism, postsurgical     S/P  TOTAL THYROIDECTOMY DUE TO RIGHT MULTINODULAR GOITER--  HYPERPLASIA  . GERD (gastroesophageal reflux disease)   . History of adenomatous polyp of colon     TUBULAR ADENOMA-- HYPERPLASTIC  . Hydronephrosis, right   . Nocturia   . Benign positional vertigo   . Arthritis     right knee   . PVC's (premature ventricular contractions)   . Disease of pericardium     unspecified- per dr Stanford Breed note--  per last echo small pericardial effusion 03-11-2013   . Full dentures   . Cancer     pancreatic    Past Surgical History  Procedure Laterality Date  . Total abdominal hysterectomy w/ bilateral salpingoophorectomy  1984  . Knee arthroscopy Right 12-21-2013  . Thyroidectomy N/A 12/15/2013    Procedure: TOTAL THYROIDECTOMY;  Surgeon: Earnstine Regal, MD;  Location: [facility];  Service: General;  Laterality: N/A;  . Laparoscopic right hemicolectomy  03-12-2001    TUBULAR ADENOMA POLYP  . Colonoscopy w/ polypectomy  last one 2014  . Transthoracic echocardiogram  03-11-2013   dr Stanford Breed    grade I diastolic dysfunction/  ef 55-60%/   mild MR/  trivial PR/  small pericardial effusion identified  . Cystoscopy with retrograde pyelogram, ureteroscopy and stent placement Right 11/10/2014    Procedure: CYSTO/BILATERAL RETROGRADE PYELOGRAM/RIGHT  URETEROSCOPY WITH BRUSH BIOPSY/RIGHT URETERAL STENT PLACEMENT;  Surgeon: Ardis Hughs, MD;  Location: [facility];  Service: Urology;  Laterality: Right;  . Eus N/A 12/03/2014    Procedure: UPPER ENDOSCOPIC ULTRASOUND (EUS) LINEAR;  Surgeon: Milus Banister, MD;  Location: [facility];  Service: Endoscopy;  Laterality: N/A;    Clayton Bibles, MS, RD, LDN Pager: [phone] After Hours Pager: [phone]

## 2015-02-03 NOTE — Progress Notes (Signed)
Alison [name] SWF:[id number] DOB: 1934/05/18 DOA: 02/02/2015 PCP: Olga Millers, MD  Brief narrative: 81 y/o ? known h/o stg IV pancreatic Ca admitted 4/26 c Abd discomfort, weakness and dizzyness Found to havbe creat 9 Had R ureteric stent placed 12/2014--was to follow May 23rd for consdieration change of stent  Past medical history-As per Problem list   Chart reviewed as below-   Consultants:  Urology  Procedures:  None yet  Antibiotics:  none   Subjective  Doing fair Had HA earlier and also on wants suppositroy   Objective       Objective: Filed Vitals:   02/02/15 1810 02/02/15 1812 02/02/15 2126 02/03/15 0521  BP:  147/66 148/64 143/68  Pulse:  76 74 80  Temp:  97.8 F (36.6 C) 98.4 F (36.9 C) 98.5 F (36.9 C)  TempSrc:  Oral Oral Oral  Resp:  16 16 16   Height: 5\' 3"  (1.6 m)     Weight: 67.586 kg (149 lb)     SpO2:  100% 100% 100%    Intake/Output Summary (Last 24 hours) at 02/03/15 1518 Last data filed at 02/03/15 0600  Gross per 24 hour  Intake 3047.5 ml  Output    125 ml  Net 2922.5 ml    Exam:  General: Eomi ncat Cardiovascular: S1 s2 no m/r/g Respiratory: Clear no added sound Abdomen: Soft nt nd no rebound Skin Soft le no swelling but some plantar pain Neuro Power 5/5  Data Reviewed: Basic Metabolic Panel:  Recent Labs Lab 02/02/15 1409 02/03/15 0416  NA 132* 133*  K 6.2* 5.3*  CL 97 102  CO2 22 21  GLUCOSE 127* 107*  BUN 82* 82*  CREATININE 9.00* 9.13*  CALCIUM 8.5 8.3*   Liver Function Tests:  Recent Labs Lab 02/02/15 1409 02/03/15 0416  AST 14 55*  ALT 30 51*  ALKPHOS 103 105  BILITOT 0.4 0.5  PROT 6.3 5.4*  ALBUMIN 3.6 3.0*   No results for input(s): LIPASE, AMYLASE in the last 168 hours. No results for input(s): AMMONIA in the last 168 hours. CBC:  Recent Labs Lab 02/02/15 1409 02/03/15 0416  WBC 6.7 5.7  NEUTROABS 4.9  --   HGB 10.9* 9.3*  HCT 32.1* 26.9*  MCV 88.7 88.8  PLT 172 133*     Cardiac Enzymes: No results for input(s): CKTOTAL, CKMB, CKMBINDEX, TROPONINI in the last 168 hours. BNP: Invalid input(s): POCBNP CBG: No results for input(s): GLUCAP in the last 168 hours.  No results found for this or any previous visit (from the past 240 hour(s)).   Studies:              All Imaging reviewed and is as per above notation   Scheduled Meds: . amLODipine  5 mg Oral Daily  . feeding supplement (RESOURCE BREEZE)  1 Container Oral TID BM  . heparin  5,000 Units Subcutaneous 3 times per day  . levothyroxine  75 mcg Oral QAC breakfast   Continuous Infusions: . sodium chloride 75 mL/hr at 02/03/15 1021     Assessment/Plan: Acute kidney injury Patient's baseline creatinine is 1.14 as of February 2016,Admission creatinine= 9.0 with BUN 82.  Nephrology was consulted by the ED physician, Dr. Posey Pronto at this time recommends to keep the patient at [facility] as she is not a dialysis candidate due to stage IV pancreatic cancer.  CT scan abd/pelv=?hydronephrosis L side Continue normal saline at 75 per hour. Urology consulted for opinion And appreciate input in  advacne-will probably need re-visit of stent Follow renal functions in a.m.  Hyperkalemia Potassium 6.2-->5.3 Repeat Kayexalate . Follow BMP in a.m.  Stage IV pancreatic cancer Patient has opted for comfort care approach, no chemotherapy.  Not requiring any pain medication at this time Will start Dilaudid when necessary for pain  Hypothyroidism Continue Synthroid  Code Status: Full Family Communication:d w husband and daughter at bedsdie, >20 min discussion Disposition Plan: inpatient pending resolution48 hrs at least   Verneita Griffes, MD  [facility] Pager [phone] 02/03/2015, 3:18 PM    LOS: 1 day

## 2015-02-03 NOTE — Progress Notes (Signed)
I performed a history and physical examination of the patient and discussed his management with the resident.  I reviewed the resident's note and agree with the documented findings and plan of care    i spoke with family and will check back with them in the am

## 2015-02-04 ENCOUNTER — Telehealth: Payer: Self-pay | Admitting: Internal Medicine

## 2015-02-04 LAB — CBC WITH DIFFERENTIAL/PLATELET
BASOS ABS: 0 10*3/uL (ref 0.0–0.1)
BASOS PCT: 0 % (ref 0–1)
Eosinophils Absolute: 0.1 10*3/uL (ref 0.0–0.7)
Eosinophils Relative: 2 % (ref 0–5)
HCT: 25.9 % — ABNORMAL LOW (ref 36.0–46.0)
Hemoglobin: 8.9 g/dL — ABNORMAL LOW (ref 12.0–15.0)
LYMPHS PCT: 21 % (ref 12–46)
Lymphs Abs: 1.1 10*3/uL (ref 0.7–4.0)
MCH: 30.7 pg (ref 26.0–34.0)
MCHC: 34.4 g/dL (ref 30.0–36.0)
MCV: 89.3 fL (ref 78.0–100.0)
Monocytes Absolute: 0.4 10*3/uL (ref 0.1–1.0)
Monocytes Relative: 8 % (ref 3–12)
NEUTROS PCT: 69 % (ref 43–77)
Neutro Abs: 3.6 10*3/uL (ref 1.7–7.7)
PLATELETS: 121 10*3/uL — AB (ref 150–400)
RBC: 2.9 MIL/uL — ABNORMAL LOW (ref 3.87–5.11)
RDW: 13.1 % (ref 11.5–15.5)
WBC: 5.3 10*3/uL (ref 4.0–10.5)

## 2015-02-04 LAB — BASIC METABOLIC PANEL
Anion gap: 10 (ref 5–15)
BUN: 85 mg/dL — ABNORMAL HIGH (ref 6–23)
CALCIUM: 8.1 mg/dL — AB (ref 8.4–10.5)
CO2: 18 mmol/L — AB (ref 19–32)
Chloride: 105 mmol/L (ref 96–112)
Creatinine, Ser: 10.05 mg/dL — ABNORMAL HIGH (ref 0.50–1.10)
GFR calc Af Amer: 4 mL/min — ABNORMAL LOW (ref >90)
GFR calc non Af Amer: 3 mL/min — ABNORMAL LOW (ref >90)
GLUCOSE: 104 mg/dL — AB (ref 70–99)
Potassium: 5.2 mmol/L — ABNORMAL HIGH (ref 3.5–5.1)
Sodium: 133 mmol/L — ABNORMAL LOW (ref 135–145)

## 2015-02-04 MED ORDER — HYDROMORPHONE HCL 1 MG/ML PO LIQD: 1.0000 mg | ORAL | Status: AC | PRN

## 2015-02-04 NOTE — Discharge Summary (Signed)
Physician Discharge Summary  Alison [name] BDZ:[id number] DOB: 08-Jan-1934 DOA: 02/02/2015  PCP: Olga Millers, MD  Admit date: 02/02/2015 Discharge date: 02/04/2015  Time spent: 35 minutes  Recommendations for Outpatient Follow-up:  1. Hospice to follow patient once d/c home-choice has been given to patient/family 2. patient is disinclined to pursue any aggressive medical therapy   Discharge Diagnoses:  Active Problems:   Essential hypertension   Hypothyroidism, postsurgical   Pancreatic adenocarcinoma   AKI (acute kidney injury)   Discharge Condition: guarded  Diet recommendation: comfort  Filed Weights   02/02/15 1810  Weight: 67.586 kg (149 lb)    History of present illness:  81 y/o ? known h/o stg IV pancreatic Ca admitted 4/26 c Abd discomfort, weakness and dizzyness Found to havbe creat 9 Had R ureteric stent placed 12/2014--was to follow May 23rd for consdieration change of stent  Hospital Course:  Acute kidney injury Patient's baseline creatinine is 1.14 as of February 2016,Admission creatinine= 9.0 with BUN 82. Nephrology was consulted by the ED physician, Dr. Posey Pronto recommended no aggressive measures as not a dialysis candidate due to stage IV pancreatic cancer. CT scan abd/pelv=?hydronephrosis L side transiently placed on normal saline at 75 per hour. Urology consulted for opinion And appreciate input in advacne-will probably need re-visit of stent--patient was disinclined to pursue any further interventions and hospice was recommended to the patient which patient wanted to pursue  Hyperkalemia Potassium 6.2-->5.3 we stopped checking labs  Stage IV pancreatic cancer Patient has opted for comfort care approach, no chemotherapy.  patient given a prescription for Dilaudid on discharge which can be refilled by hospice physician  HypothyroidismI have discontinued all meds as prognosis is extremely guarded Hypertension  " "" "" " " " " " " "    Consultants:  Urology  Procedures:  None yet  Discharge Exam: Filed Vitals:   02/04/15 0528  BP: 129/67  Pulse: 77  Temp: 98.4 F (36.9 C)  Resp: 16    General: alert pleasant oriented Cardiovascular: S1-S2 no murmur rub or gallop Respiratory: clinically clear  Discharge Instructions   Discharge Instructions    Diet - low sodium heart healthy    Complete by:  As directed      Discharge instructions    Complete by:  As directed   Hospice will come see you I have given you a prescription for pain if you experience it God bless you     Increase activity slowly    Complete by:  As directed           Current Discharge Medication List    START taking these medications   Details  HYDROmorphone HCl (DILAUDID) 1 MG/ML LIQD Take 1 mL (1 mg total) by mouth every 4 (four) hours as needed for severe pain. Qty: 56 mL, Refills: 0      CONTINUE these medications which have NOT CHANGED   Details  polyethylene glycol (MIRALAX / GLYCOLAX) packet Take 17 g by mouth daily as needed for mild constipation.     VESICARE 5 MG tablet Take 5 mg by mouth daily. Refills: 6      STOP taking these medications     amLODipine (NORVASC) 5 MG tablet      levothyroxine (SYNTHROID, LEVOTHROID) 75 MCG tablet      triamcinolone cream (KENALOG) 0.5 %      vitamin C (ASCORBIC ACID) 500 MG tablet        Allergies  Allergen Reactions  . Tetanus Toxoid  Rash      The results of significant diagnostics from this hospitalization (including imaging, microbiology, ancillary and laboratory) are listed below for reference.    Significant Diagnostic Studies: Ct Abdomen Pelvis Wo Contrast  02/02/2015   CLINICAL DATA:  Decreased appetite.  History of pancreas cancer.  EXAM: CT ABDOMEN AND PELVIS WITHOUT CONTRAST  TECHNIQUE: Multidetector CT imaging of the abdomen and pelvis was performed following the standard protocol without IV contrast.  COMPARISON:  10/16/2014   FINDINGS: Lower chest: The lung bases appear clear. Mild pleural thickening in the posterior left lung base noted.  Hepatobiliary: No focal liver abnormality identified. The gallbladder appears filled with sludge or small stones and is collapsed. No biliary dilatation.  Pancreas: Margins of known pancreatic tail mass are difficult to identify due to lack of contrast. This measures approximately 4.7 x 2.6 cm, image 26/ series 2. Previously 3.4 x 2.2 cm.  Spleen: Unremarkable.  Adrenals/Urinary Tract: Normal appearance of the adrenal glands. Right-sided nephro ureteral stent is in place. There is continued right hydronephrosis. New left hydronephrosis and hydroureter is identified to the level of the pelvis. Urinary bladder appears normal.  Stomach/Bowel: The stomach appears normal. The small bowel loops have a normal caliber without evidence for bowel obstruction. Postoperative changes from right hemicolectomy identified. The mid and distal large bowel loops have a normal caliber without obstruction.  Vascular/Lymphatic: Calcified atherosclerotic disease involves the abdominal aorta. No aneurysm. No retroperitoneal or pelvic adenopathy. No inguinal adenopathy noted. Tumor encasement of the superior mesenteric vein is identified. Again noted is a collateral vessel arising from the portal venous confluence, image 32 of series 2.  Reproductive: Previous hysterectomy.  Other: Extensive peritoneal spread of tumor is identified within the abdomen and pelvis. Soft tissue lesion within the lower abdominal mesenteric measures 2.3 x 2.0 cm, image 48/series 2. This appears to encase the superior mesenteric vein On the previous exam this measured 1.4 x 1.2 cm. Peritoneal deposit within the right iliac fossa measures 1.9 by 1.4 cm, image 49/series 2. Previously this measured 1.5 x 1.1 cm. New peritoneal lesion along the undersurface of the ventral abdominal wall measures 1 cm, image 44/series 2. Increased soft tissue within the  lower pelvis associated with the left side of vaginal cuff measures 3.6 cm, image 68/series 2 previously 1.8 cm. This likely accounts for the new left-sided obstructive uropathy.  Musculoskeletal: There is no aggressive lytic or sclerotic bone lesion. Multi level degenerative disc disease noted within the lumbar spine. Anterolisthesis of L5 on S1 is noted.  IMPRESSION: 1. There has been interval progression of trans peritoneal spread of tumor within the abdomen and pelvis. 2. New left-sided obstructive uropathy which is felt a likely be secondary to a tumor deposit within the pelvis near the left side of vaginal cuff. 3. Status post right-sided nephro ureteral stenting with persistent right hydro nephrosis. 4. Mesenteric tumor appears to encase the superior mesenteric vein. Cannot confirm patency of the SMV secondary to lack of IV contrast material. 5. Increase in size of pancreatic mass.   Electronically Signed   By: Kerby Moors M.D.   On: 02/02/2015 17:45   Dg Abd 1 View  02/02/2015   CLINICAL DATA:  Pancreatic cancer. Loss of appetite. Loss of energy. Small amount of blood in bowel movement.  EXAM: ABDOMEN - 1 VIEW  COMPARISON:  CT 10/16/2014  FINDINGS: Right ureteral stent in place. Nonobstructive bowel gas pattern. No free air organomegaly. No suspicious calcification. No acute bony abnormality.  IMPRESSION:  No acute findings.   Electronically Signed   By: Rolm Baptise M.D.   On: 02/02/2015 15:43    Microbiology: No results found for this or any previous visit (from the past 240 hour(s)).   Labs: Basic Metabolic Panel:  Recent Labs Lab 02/02/15 1409 02/03/15 0416 02/04/15 0355  NA 132* 133* 133*  K 6.2* 5.3* 5.2*  CL 97 102 105  CO2 22 21 18*  GLUCOSE 127* 107* 104*  BUN 82* 82* 85*  CREATININE 9.00* 9.13* 10.05*  CALCIUM 8.5 8.3* 8.1*   Liver Function Tests:  Recent Labs Lab 02/02/15 1409 02/03/15 0416  AST 14 55*  ALT 30 51*  ALKPHOS 103 105  BILITOT 0.4 0.5  PROT 6.3  5.4*  ALBUMIN 3.6 3.0*   No results for input(s): LIPASE, AMYLASE in the last 168 hours. No results for input(s): AMMONIA in the last 168 hours. CBC:  Recent Labs Lab 02/02/15 1409 02/03/15 0416 02/04/15 0355  WBC 6.7 5.7 5.3  NEUTROABS 4.9  --  3.6  HGB 10.9* 9.3* 8.9*  HCT 32.1* 26.9* 25.9*  MCV 88.7 88.8 89.3  PLT 172 133* 121*   Cardiac Enzymes: No results for input(s): CKTOTAL, CKMB, CKMBINDEX, TROPONINI in the last 168 hours. BNP: BNP (last 3 results) No results for input(s): BNP in the last 8760 hours.  ProBNP (last 3 results) No results for input(s): PROBNP in the last 8760 hours.  CBG: No results for input(s): GLUCAP in the last 168 hours.     SignedNita Sells  [facility] 02/04/2015, 11:16 AM

## 2015-02-04 NOTE — Telephone Encounter (Signed)
She needs a verbal approval to issue a DNR

## 2015-02-04 NOTE — Progress Notes (Signed)
Discharged instructions given to patient and daughter with verbal understanding. Discharged to home.

## 2015-02-04 NOTE — Telephone Encounter (Signed)
Pt daughter called and has some questions about pt meds.  She was concerned because pt BP meds and thyroid meds are not on the list?    Best number [phone]

## 2015-02-04 NOTE — Telephone Encounter (Signed)
Yes will be attending and they can do symptom management.

## 2015-02-04 NOTE — Progress Notes (Signed)
Subjective: No complaints.   Objective: Vital signs in last 24 hours: Temp:  [98 F (36.7 C)-98.5 F (36.9 C)] 98.4 F (36.9 C) (04/28 0528) Pulse Rate:  [77-86] 77 (04/28 0528) Resp:  [16-18] 16 (04/28 0528) BP: (129-142)/(61-84) 129/67 mmHg (04/28 0528) SpO2:  [97 %-100 %] 100 % (04/28 0528)  Intake/Output from previous day: 04/27 0701 - 04/28 0700 In: 970 [P.O.:970] Out: -  Intake/Output this shift: Total I/O In: 610 [P.O.:610] Out: -   Physical Exam:  General: Alert and oriented CV: RRR Lungs: Clear Abdomen: Soft, ND Ext: NT, No erythema  Lab Results:  Recent Labs  02/02/15 1409 02/03/15 0416 02/04/15 0355  HGB 10.9* 9.3* 8.9*  HCT 32.1* 26.9* 25.9*   BMET  Recent Labs  02/03/15 0416 02/04/15 0355  NA 133* 133*  K 5.3* 5.2*  CL 102 105  CO2 21 18*  GLUCOSE 107* 104*  BUN 82* 85*  CREATININE 9.13* 10.05*  CALCIUM 8.3* 8.1*     Studies/Results: Ct Abdomen Pelvis Wo Contrast  02/02/2015   CLINICAL DATA:  Decreased appetite.  History of pancreas cancer.  EXAM: CT ABDOMEN AND PELVIS WITHOUT CONTRAST  TECHNIQUE: Multidetector CT imaging of the abdomen and pelvis was performed following the standard protocol without IV contrast.  COMPARISON:  10/16/2014  FINDINGS: Lower chest: The lung bases appear clear. Mild pleural thickening in the posterior left lung base noted.  Hepatobiliary: No focal liver abnormality identified. The gallbladder appears filled with sludge or small stones and is collapsed. No biliary dilatation.  Pancreas: Margins of known pancreatic tail mass are difficult to identify due to lack of contrast. This measures approximately 4.7 x 2.6 cm, image 26/ series 2. Previously 3.4 x 2.2 cm.  Spleen: Unremarkable.  Adrenals/Urinary Tract: Normal appearance of the adrenal glands. Right-sided nephro ureteral stent is in place. There is continued right hydronephrosis. New left hydronephrosis and hydroureter is identified to the level of the pelvis.  Urinary bladder appears normal.  Stomach/Bowel: The stomach appears normal. The small bowel loops have a normal caliber without evidence for bowel obstruction. Postoperative changes from right hemicolectomy identified. The mid and distal large bowel loops have a normal caliber without obstruction.  Vascular/Lymphatic: Calcified atherosclerotic disease involves the abdominal aorta. No aneurysm. No retroperitoneal or pelvic adenopathy. No inguinal adenopathy noted. Tumor encasement of the superior mesenteric vein is identified. Again noted is a collateral vessel arising from the portal venous confluence, image 32 of series 2.  Reproductive: Previous hysterectomy.  Other: Extensive peritoneal spread of tumor is identified within the abdomen and pelvis. Soft tissue lesion within the lower abdominal mesenteric measures 2.3 x 2.0 cm, image 48/series 2. This appears to encase the superior mesenteric vein On the previous exam this measured 1.4 x 1.2 cm. Peritoneal deposit within the right iliac fossa measures 1.9 by 1.4 cm, image 49/series 2. Previously this measured 1.5 x 1.1 cm. New peritoneal lesion along the undersurface of the ventral abdominal wall measures 1 cm, image 44/series 2. Increased soft tissue within the lower pelvis associated with the left side of vaginal cuff measures 3.6 cm, image 68/series 2 previously 1.8 cm. This likely accounts for the new left-sided obstructive uropathy.  Musculoskeletal: There is no aggressive lytic or sclerotic bone lesion. Multi level degenerative disc disease noted within the lumbar spine. Anterolisthesis of L5 on S1 is noted.  IMPRESSION: 1. There has been interval progression of trans peritoneal spread of tumor within the abdomen and pelvis. 2. New left-sided obstructive uropathy which is  felt a likely be secondary to a tumor deposit within the pelvis near the left side of vaginal cuff. 3. Status post right-sided nephro ureteral stenting with persistent right hydro nephrosis.  4. Mesenteric tumor appears to encase the superior mesenteric vein. Cannot confirm patency of the SMV secondary to lack of IV contrast material. 5. Increase in size of pancreatic mass.   Electronically Signed   By: Kerby Moors M.D.   On: 02/02/2015 17:45   Dg Abd 1 View  02/02/2015   CLINICAL DATA:  Pancreatic cancer. Loss of appetite. Loss of energy. Small amount of blood in bowel movement.  EXAM: ABDOMEN - 1 VIEW  COMPARISON:  CT 10/16/2014  FINDINGS: Right ureteral stent in place. Nonobstructive bowel gas pattern. No free air organomegaly. No suspicious calcification. No acute bony abnormality.  IMPRESSION: No acute findings.   Electronically Signed   By: Rolm Baptise M.D.   On: 02/02/2015 15:43    Assessment/Plan: After thorough discussion of options for ARF in the setting of progressive malignant obstruction, the patient and family to not want any further invasive procedures. They understand that this progressive renal failure is likely terminal with possible rapid deterioration.  Recommended home hospice services.    LOS: 2 days   Ryver Zadrozny C 02/04/2015, 6:59 AM

## 2015-02-04 NOTE — Care Management Note (Signed)
CARE MANAGEMENT NOTE 02/04/2015  Patient:  Alison [name], Alison [name]   Account Number:  [id number]  Date Initiated:  02/04/2015  Documentation initiated by:  Marney Doctor  Subjective/Objective Assessment:   81 yo admitted with AKI, Hx of stage 4 pancreatic CA     Action/Plan:   From home with Husband   Anticipated DC Date:  02/04/2015   Anticipated DC Plan:  [place]  CM consult      PAC Choice  HOSPICE   Choice offered to / List presented to:  C-4 Adult Children           [place] agency  [facility] [place] [facility]   Status of service:  In process, will continue to follow Medicare Important Message given?   (If response is "NO", the following Medicare IM given date fields will be blank) Date Medicare IM given:   Medicare IM given by:   Date Additional Medicare IM given:   Additional Medicare IM given by:    Discharge Disposition:    Per UR Regulation:  Reviewed for med. necessity/level of care/duration of stay  If discussed at [place] of Stay Meetings, dates discussed:    Comments:  02/04/15 Marney Doctor RN,BSN,NCM [phone] CM consult for Home Hospice choice. Met with pt, husband and daughter at bedside to offer choice. They chose [facility], and referral called into [facility] referral line. Daughter states pt will need hospital bed and 3 in 1 for home. This info given to [facility]. Daughter plans to take pt home in the car. No other needs noted at this time. MD and RN made aware of disposition plans.   Lynnell Catalan, RN 02/04/2015, 12:43 PM

## 2015-02-04 NOTE — Telephone Encounter (Signed)
Alison [name] is asking if dr Doug Sou would agree to be attending provider for hospice. Also if she would like them to do symptom management. Patient is being discharged today.

## 2015-02-05 ENCOUNTER — Telehealth: Payer: Self-pay | Admitting: *Deleted

## 2015-02-05 NOTE — Telephone Encounter (Signed)
Spoke to patient's daughter. The patient has been in the hospital. The daughter will call and schedule a hospital follow up visit with Dr. Doug Sou to discuss recent medication changes.

## 2015-02-05 NOTE — Telephone Encounter (Signed)
Pt daughter called back regarding note below.

## 2015-02-05 NOTE — Telephone Encounter (Signed)
Called daughter, Joycelyn Schmid and also advised hospice nurse, Gwenette Greet that dr Doug Sou has given verbal order for DNR

## 2015-02-05 NOTE — Telephone Encounter (Signed)
Please give verbal approval.

## 2015-02-05 NOTE — Telephone Encounter (Signed)
Pt was on TCM list d/c 02/04/15. Pt is actually going to d/c to Hospice did not schedule tcm appt...Alison [name]

## 2015-02-09 ENCOUNTER — Ambulatory Visit (INDEPENDENT_AMBULATORY_CARE_PROVIDER_SITE_OTHER): Payer: Medicare Other | Admitting: Internal Medicine

## 2015-02-09 ENCOUNTER — Encounter: Payer: Self-pay | Admitting: Internal Medicine

## 2015-02-09 VITALS — BP 170/80 | HR 84 | Temp 97.8°F | Resp 12 | Wt 143.4 lb

## 2015-02-09 DIAGNOSIS — N179 Acute kidney failure, unspecified: Secondary | ICD-10-CM | POA: Diagnosis not present

## 2015-02-09 DIAGNOSIS — I10 Essential (primary) hypertension: Secondary | ICD-10-CM

## 2015-02-09 DIAGNOSIS — C259 Malignant neoplasm of pancreas, unspecified: Secondary | ICD-10-CM

## 2015-02-09 MED ORDER — LEVOTHYROXINE SODIUM 88 MCG PO TABS: 88.0000 ug | ORAL_TABLET | Freq: Every day | ORAL | Status: AC

## 2015-02-09 MED ORDER — SENNOSIDES-DOCUSATE SODIUM 8.6-50 MG PO TABS: 1.0000 | ORAL_TABLET | Freq: Two times a day (BID) | ORAL | Status: AC

## 2015-02-09 NOTE — Assessment & Plan Note (Signed)
Suspect that some kidney function has returned since she is still urinating well and not blocked. Suspect that this benefit will be short lived as the tumor continues to grow and block the ureters again. Talked with her and her daughter and they are both aware and accepting that she will have troubles with this again. No blood work as we are focusing on comfort at this time. She is involved with hospice and DNR order in place.

## 2015-02-09 NOTE — Patient Instructions (Signed)
Your husband's appointment at the Cardiologist is on 03/12/15 at 8:30 am with Dr. Mare Ferrari. His office is at the heart care building on [address].   Please call if you need anything. I have sent in the thyroid medicine and you can start taking the ones you have at home tomorrow.   I have also sent in the senokot-d which has 2 medicines to help move the bowels. You can take 1 pill up to twice a day. If you have loose stools reduce the dose or stop the medicine. It is also okay to take the miralax everyday if you need it.

## 2015-02-09 NOTE — Assessment & Plan Note (Signed)
Not having any pain but likely lifespan is quite limited. She has dilaudid at home if she needs it. Talked about constipation and if she needs the pain medicine she may suffer more with it as well when the cancer takes up more room in her stomach the bowels could become blocked or move slower. They understand.

## 2015-02-09 NOTE — Progress Notes (Signed)
   Subjective:    Patient ID: Alison [name], female    DOB: [birth year], 81 y.o.   MRN: [id number]  HPI The patient is a 81 YO female who is coming in for hospital follow up. She has been struggling with pancreatic cancer that is metastatic within the abdomen. She was hospitalized because of urethral obstruction causing bilateral hydronephrosis and kidney failure. She had stent placed on the right but did not want to undergo stent on the left. She is urinating at home and drinking well. Denies pain. Denies nausea, itching, skin color change. Denies diarrhea but is struggling some with constipation. Bowel movement every 1-2 days with miralax. Passing gas okay in between. Is on hospice and has not needed pain medicine or nausea medicine at this time. Still with good energy and trying to spend quality time with family.   Review of Systems  Constitutional: Negative for fever, activity change, appetite change, fatigue and unexpected weight change.  HENT: Negative.   Respiratory: Negative for cough, chest tightness, shortness of breath and wheezing.   Cardiovascular: Negative for chest pain, palpitations and leg swelling.  Gastrointestinal: Positive for constipation. Negative for nausea, abdominal pain, diarrhea and abdominal distention.  Genitourinary: Negative.   Musculoskeletal: Negative.   Skin: Negative.   Neurological: Negative.   Psychiatric/Behavioral: Negative.       Objective:   Physical Exam  Constitutional: She is oriented to person, place, and time. She appears well-developed and well-nourished.  thin  HENT:  Head: Normocephalic and atraumatic.  Eyes: EOM are normal.  Neck: Normal range of motion.  Cardiovascular: Normal rate and regular rhythm.   Pulmonary/Chest: Effort normal and breath sounds normal. No respiratory distress. She has no wheezes.  Abdominal: Soft. Bowel sounds are normal.  Musculoskeletal: She exhibits no edema.  Neurological: She is alert and oriented to  person, place, and time. Coordination normal.  Skin: Skin is warm and dry.  Psychiatric: She has a normal mood and affect.  Good spirits and appropriate.    Filed Vitals:   02/09/15 1016  BP: 170/80  Pulse: 84  Temp: 97.8 F (36.6 C)  TempSrc: Oral  Resp: 12  Weight: 143 lb 6.4 oz (65.046 kg)  SpO2: 99%      Assessment & Plan:  Visit time 25 minutes, greater than 50% of which was spent in counseling and in coordination of care with the patient. Counseling given on prognosis as well as signs and symptoms to watch for and encouragement and permission to call with problems.

## 2015-02-09 NOTE — Progress Notes (Signed)
Pre visit review using our clinic review tool, if applicable. No additional management support is needed unless otherwise documented below in the visit note. 

## 2015-02-09 NOTE — Assessment & Plan Note (Signed)
She was concerned about being off her blood pressure medicines but doing well off and encouraged her that we do not need those now.

## 2015-02-22 ENCOUNTER — Telehealth: Payer: Self-pay | Admitting: Internal Medicine

## 2015-02-22 NOTE — Telephone Encounter (Signed)
Just an FYI

## 2015-02-22 NOTE — Telephone Encounter (Signed)
Jan from Hospice called to let you know that patient experience some nausea and so the ordered compazine so she can take every 6-8 hrs as needed.

## 2015-03-15 ENCOUNTER — Telehealth: Payer: Self-pay

## 2015-03-15 ENCOUNTER — Telehealth: Payer: Self-pay | Admitting: Internal Medicine

## 2015-03-15 NOTE — Telephone Encounter (Signed)
On 03/15/2015 I received a death certificate from [place]. This patient is a patient of Vertell Novak. The certificate is for burial. I am taking the certificate to the 1st floor of [place] at [facility] for signature this afternoon. On 2015/04/02 I received the death certificate back completed from [facility]. I called the funeral home to let them know the death certificate was ready for pick up.

## 2015-03-15 NOTE — Telephone Encounter (Signed)
Hospice called to let you know patient has passed March 29, 2015 at 3:13am

## 2015-04-09 DEATH — deceased

## 2015-10-14 IMAGING — CR DG CHEST 1V PORT
2 series · 2 of 2 positions shown · non-contrast
Comparison: 03/12/2014

CLINICAL DATA: Cardiac arrest and respiratory failure.

EXAM:
PORTABLE CHEST - 1 VIEW

[AP (1 of 2)]
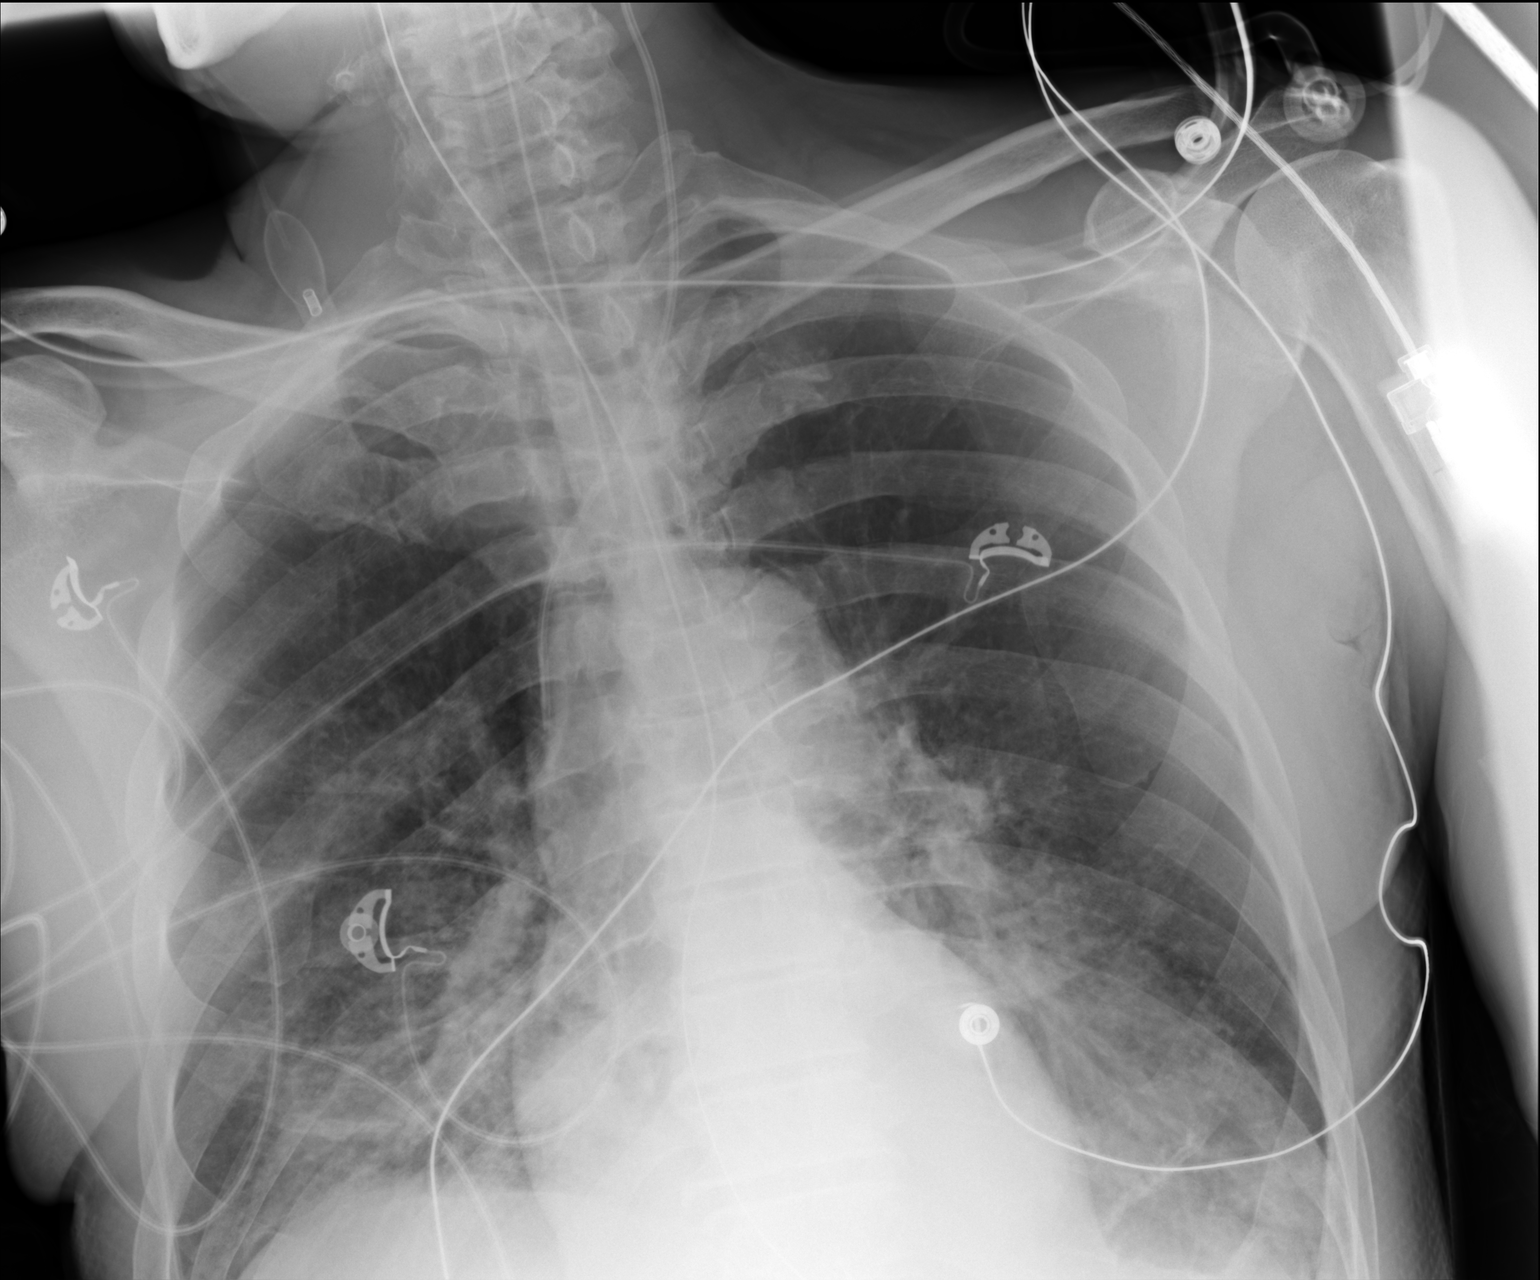

[AP (2 of 2)]
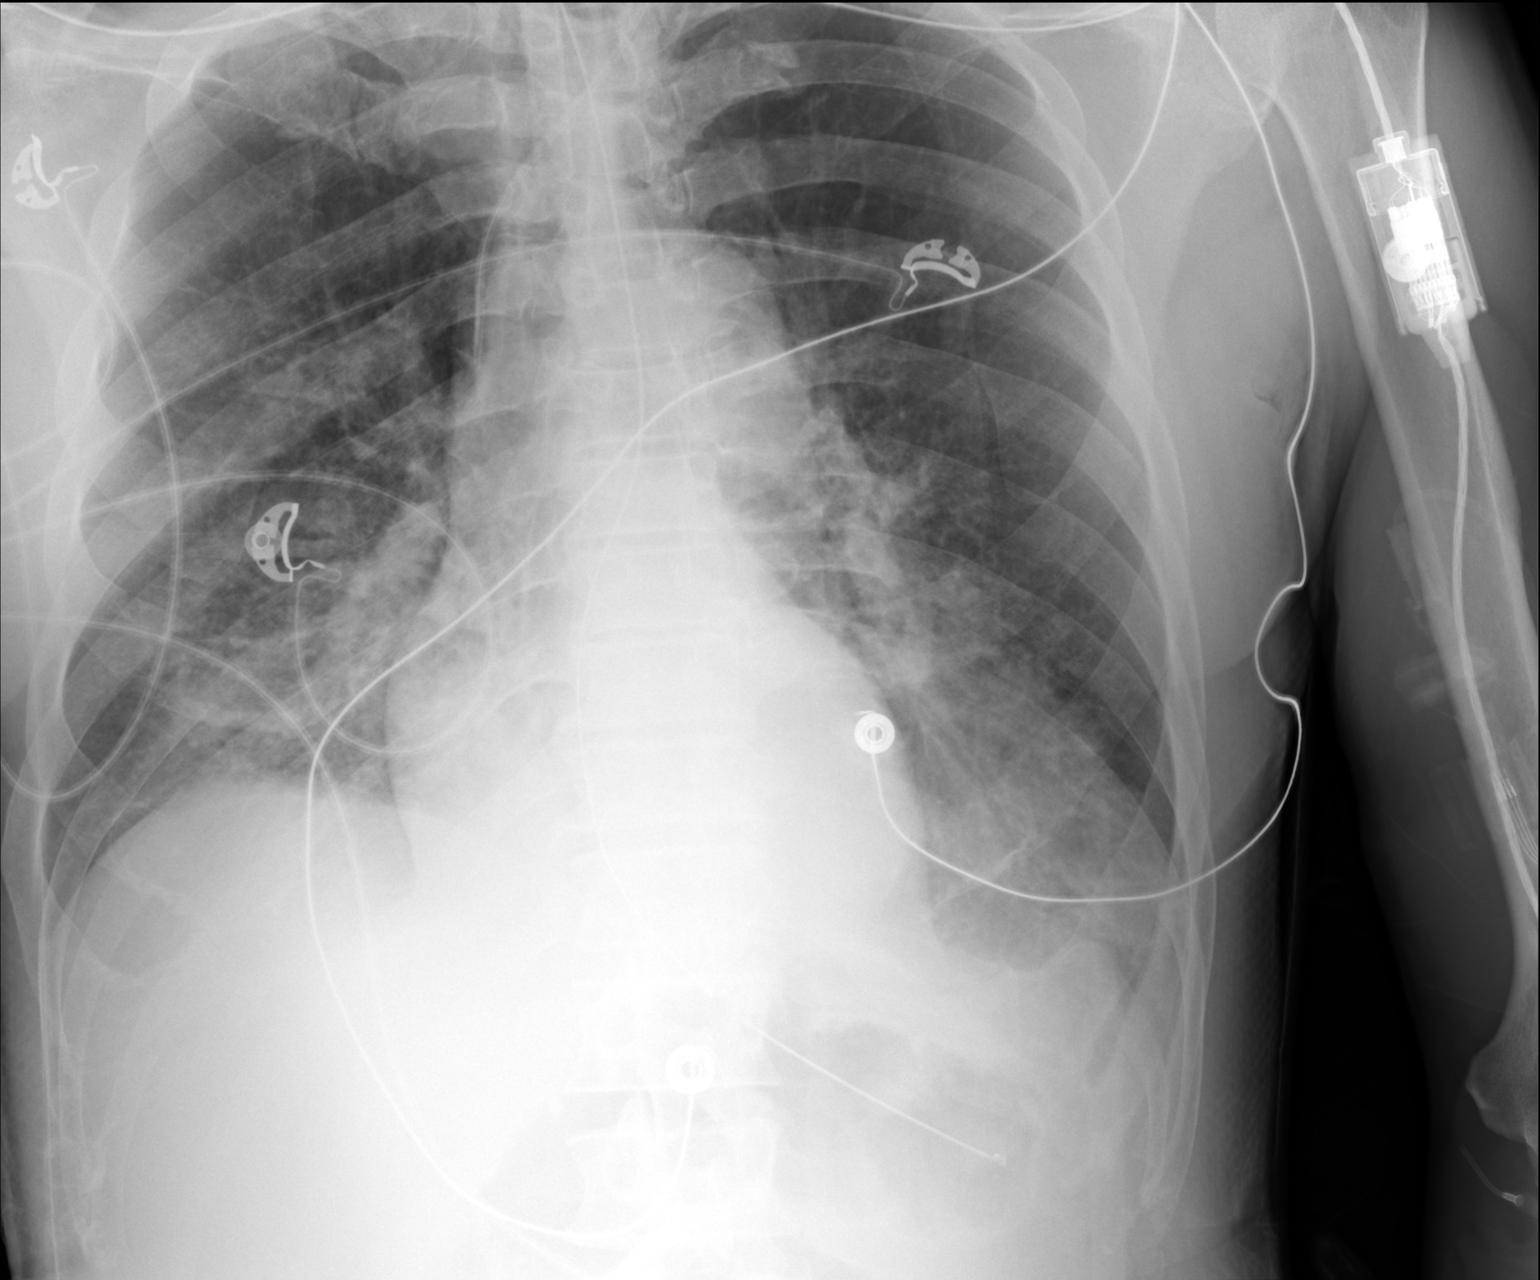

[2 of 2 positions shown; findings below may reference images not displayed]

FINDINGS: Endotracheal tube tip approximately 4 cm above the carina.
Nasogastric tube extends into the stomach. Central line positioning
stable in the SVC. Lungs show relatively stable bibasilar opacities
representing either atelectasis or infiltrates. No overt airspace
edema is identified. No pneumothorax. The heart size and mediastinal
contours are stable.
IMPRESSION: Relatively stable bibasilar atelectasis versus infiltrates.

## 2015-10-15 IMAGING — CR DG CHEST 1V PORT
1 series · 1 of 1 positions shown · non-contrast
Comparison: 03/13/2014

CLINICAL DATA: Endotracheal tube

EXAM:
PORTABLE CHEST - 1 VIEW

[AP]
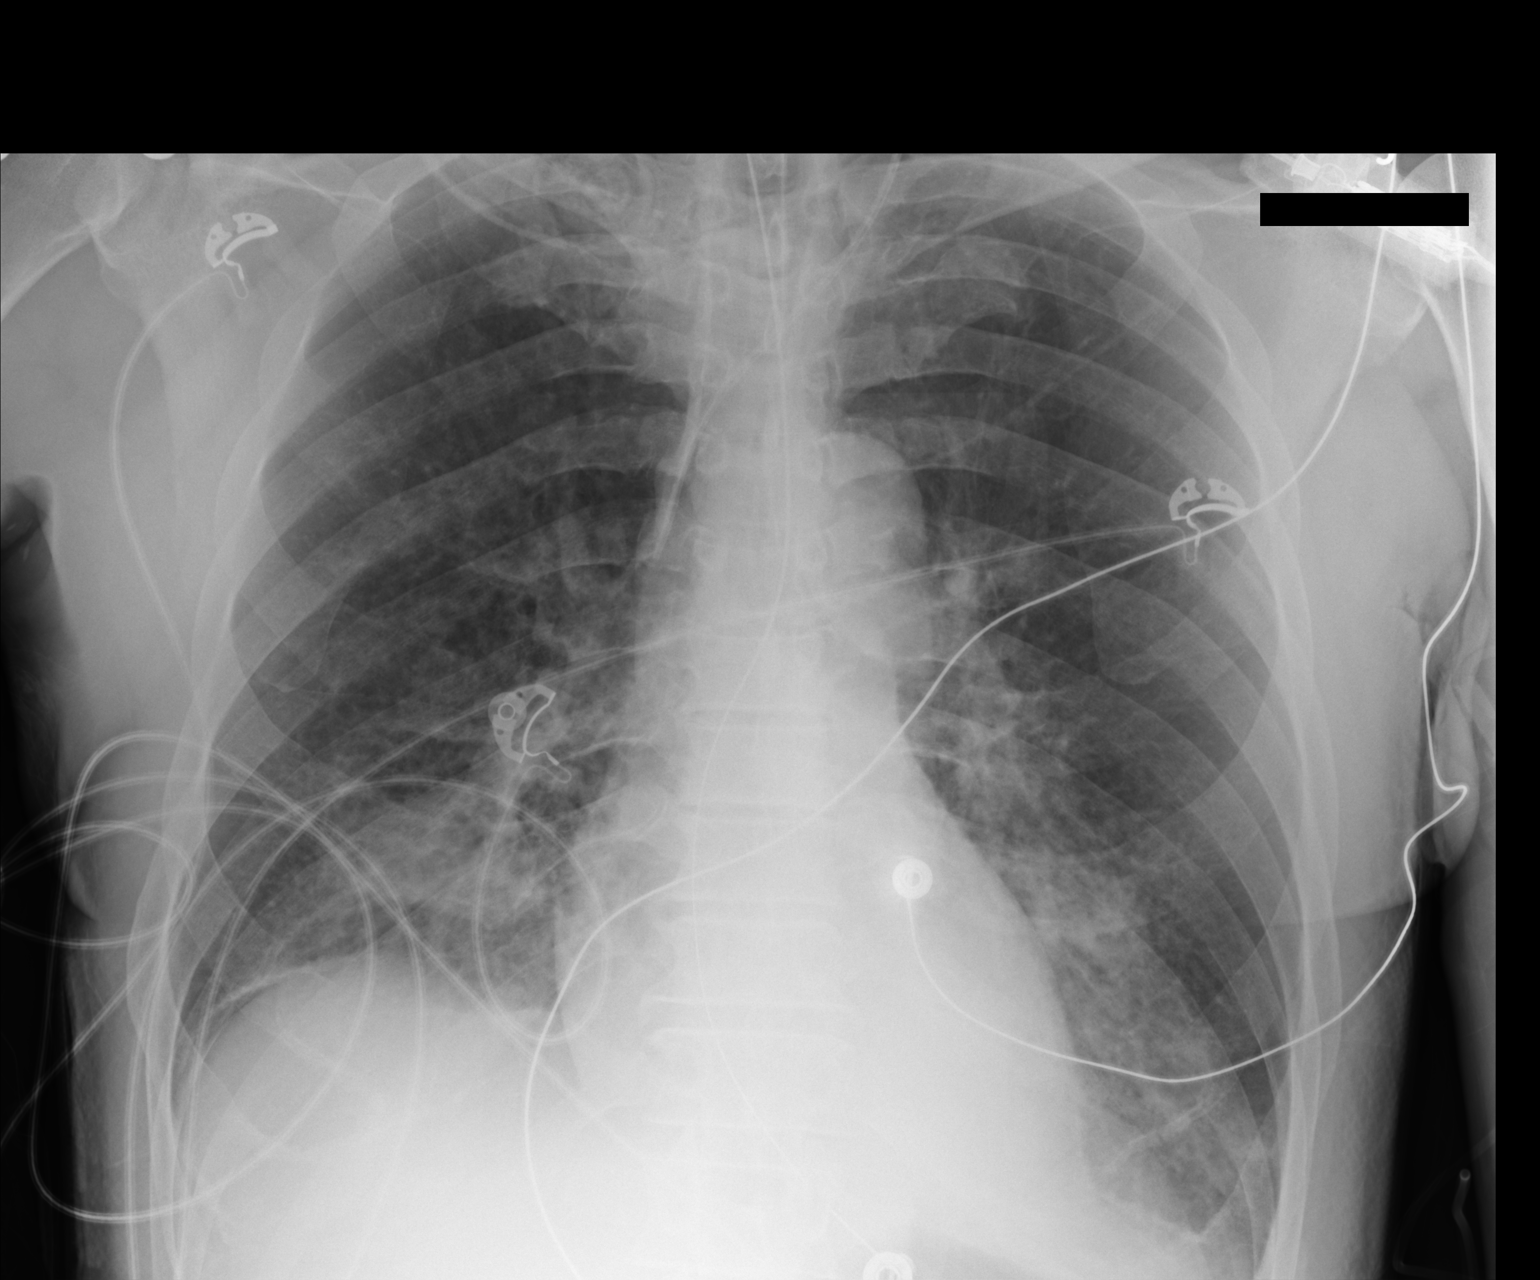

[1 of 1 positions shown; findings below may reference images not displayed]

FINDINGS: Mild patchy bilateral lower lobe opacities, atelectasis versus
pneumonia, unchanged. No pleural effusion or pneumothorax.

The heart is normal in size.

Endotracheal tube terminates 6 cm above the carina.

Stable left IJ venous catheter terminating in the mid SVC an enteric
tube coursing into the stomach with its side port at the GE
junction.
IMPRESSION: Endotracheal tube terminates 6 cm above the carina.

Mild patchy bilateral lower lobe opacities, atelectasis versus
pneumonia, unchanged.
